# Patient Record
Sex: Female | Born: 1970
Health system: Southern US, Community
[De-identification: ages and names within clinical notes are randomized; demographics above are authoritative.]

## PROBLEM LIST (undated history)

## (undated) DIAGNOSIS — J45909 Unspecified asthma, uncomplicated: Secondary | ICD-10-CM

## (undated) DIAGNOSIS — L409 Psoriasis, unspecified: Secondary | ICD-10-CM

## (undated) DIAGNOSIS — M1990 Unspecified osteoarthritis, unspecified site: Secondary | ICD-10-CM

## (undated) DIAGNOSIS — Z87442 Personal history of urinary calculi: Secondary | ICD-10-CM

## (undated) DIAGNOSIS — G5600 Carpal tunnel syndrome, unspecified upper limb: Secondary | ICD-10-CM

## (undated) DIAGNOSIS — E119 Type 2 diabetes mellitus without complications: Secondary | ICD-10-CM

## (undated) DIAGNOSIS — K219 Gastro-esophageal reflux disease without esophagitis: Secondary | ICD-10-CM

## (undated) DIAGNOSIS — M129 Arthropathy, unspecified: Secondary | ICD-10-CM

## (undated) DIAGNOSIS — K5900 Constipation, unspecified: Secondary | ICD-10-CM

## (undated) DIAGNOSIS — M797 Fibromyalgia: Secondary | ICD-10-CM

## (undated) DIAGNOSIS — K589 Irritable bowel syndrome without diarrhea: Secondary | ICD-10-CM

## (undated) DIAGNOSIS — K7689 Other specified diseases of liver: Secondary | ICD-10-CM

## (undated) DIAGNOSIS — E669 Obesity, unspecified: Secondary | ICD-10-CM

## (undated) DIAGNOSIS — I10 Essential (primary) hypertension: Secondary | ICD-10-CM

## (undated) HISTORY — DX: Unspecified asthma, uncomplicated: J45.909

## (undated) HISTORY — DX: Gastro-esophageal reflux disease without esophagitis: K21.9

## (undated) HISTORY — DX: Other specified diseases of liver: K76.89

## (undated) HISTORY — PX: VAGINAL HYSTERECTOMY: SHX2639

## (undated) HISTORY — PX: BLADDER SURGERY: SHX569

## (undated) HISTORY — DX: Carpal tunnel syndrome, unspecified upper limb: G56.00

## (undated) HISTORY — DX: Type 2 diabetes mellitus without complications: E11.9

## (undated) HISTORY — DX: Constipation, unspecified: K59.00

## (undated) HISTORY — DX: Personal history of urinary calculi: Z87.442

## (undated) HISTORY — PX: COLONOSCOPY: SHX174

## (undated) HISTORY — DX: Arthropathy, unspecified: M12.9

## (undated) HISTORY — DX: Fibromyalgia: M79.7

## (undated) HISTORY — DX: Obesity, unspecified: E66.9

## (undated) HISTORY — DX: Essential (primary) hypertension: I10

## (undated) HISTORY — PX: OTHER SURGICAL HISTORY: SHX169

## (undated) HISTORY — DX: Irritable bowel syndrome, unspecified: K58.9

## (undated) HISTORY — DX: Unspecified osteoarthritis, unspecified site: M19.90

---

## 1999-05-07 ENCOUNTER — Emergency Department (HOSPITAL_COMMUNITY): Admission: EM | Admit: 1999-05-07 | Discharge: 1999-05-07 | Payer: Self-pay | Admitting: Emergency Medicine

## 1999-05-08 ENCOUNTER — Encounter: Payer: Self-pay | Admitting: Emergency Medicine

## 2001-06-30 ENCOUNTER — Inpatient Hospital Stay (HOSPITAL_COMMUNITY): Admission: AD | Admit: 2001-06-30 | Discharge: 2001-06-30 | Payer: Self-pay | Admitting: Obstetrics and Gynecology

## 2001-12-12 ENCOUNTER — Inpatient Hospital Stay (HOSPITAL_COMMUNITY): Admission: AD | Admit: 2001-12-12 | Discharge: 2001-12-12 | Payer: Self-pay | Admitting: Obstetrics and Gynecology

## 2002-02-20 ENCOUNTER — Inpatient Hospital Stay (HOSPITAL_COMMUNITY): Admission: AD | Admit: 2002-02-20 | Discharge: 2002-02-23 | Payer: Self-pay | Admitting: Obstetrics & Gynecology

## 2002-02-27 ENCOUNTER — Inpatient Hospital Stay (HOSPITAL_COMMUNITY): Admission: AD | Admit: 2002-02-27 | Discharge: 2002-02-27 | Payer: Self-pay | Admitting: Obstetrics and Gynecology

## 2002-03-02 ENCOUNTER — Inpatient Hospital Stay (HOSPITAL_COMMUNITY): Admission: AD | Admit: 2002-03-02 | Discharge: 2002-03-02 | Payer: Self-pay | Admitting: Obstetrics and Gynecology

## 2002-04-05 ENCOUNTER — Other Ambulatory Visit: Admission: RE | Admit: 2002-04-05 | Discharge: 2002-04-05 | Payer: Self-pay | Admitting: *Deleted

## 2003-03-27 ENCOUNTER — Encounter: Admission: RE | Admit: 2003-03-27 | Discharge: 2003-03-27 | Payer: Self-pay | Admitting: Family Medicine

## 2003-03-27 ENCOUNTER — Encounter: Payer: Self-pay | Admitting: Family Medicine

## 2003-04-16 ENCOUNTER — Other Ambulatory Visit: Admission: RE | Admit: 2003-04-16 | Discharge: 2003-04-16 | Payer: Self-pay | Admitting: *Deleted

## 2003-06-20 ENCOUNTER — Encounter: Admission: RE | Admit: 2003-06-20 | Discharge: 2003-06-20 | Payer: Self-pay | Admitting: Internal Medicine

## 2003-06-20 ENCOUNTER — Encounter: Payer: Self-pay | Admitting: Internal Medicine

## 2004-04-23 ENCOUNTER — Other Ambulatory Visit: Admission: RE | Admit: 2004-04-23 | Discharge: 2004-04-23 | Payer: Self-pay | Admitting: *Deleted

## 2004-07-27 ENCOUNTER — Encounter: Admission: RE | Admit: 2004-07-27 | Discharge: 2004-07-27 | Payer: Self-pay | Admitting: Family Medicine

## 2004-09-21 ENCOUNTER — Encounter: Admission: RE | Admit: 2004-09-21 | Discharge: 2004-09-21 | Payer: Self-pay | Admitting: Family Medicine

## 2005-01-14 ENCOUNTER — Ambulatory Visit: Payer: Self-pay | Admitting: Gastroenterology

## 2005-01-15 ENCOUNTER — Encounter: Admission: RE | Admit: 2005-01-15 | Discharge: 2005-01-15 | Payer: Self-pay | Admitting: Gastroenterology

## 2005-01-29 ENCOUNTER — Ambulatory Visit: Payer: Self-pay | Admitting: Gastroenterology

## 2005-02-05 ENCOUNTER — Encounter: Admission: RE | Admit: 2005-02-05 | Discharge: 2005-05-06 | Payer: Self-pay | Admitting: Gastroenterology

## 2005-04-30 ENCOUNTER — Ambulatory Visit: Payer: Self-pay | Admitting: Gastroenterology

## 2005-04-30 ENCOUNTER — Other Ambulatory Visit: Admission: RE | Admit: 2005-04-30 | Discharge: 2005-04-30 | Payer: Self-pay | Admitting: Obstetrics and Gynecology

## 2005-11-16 ENCOUNTER — Ambulatory Visit: Payer: Self-pay | Admitting: Gastroenterology

## 2006-08-11 ENCOUNTER — Encounter: Admission: RE | Admit: 2006-08-11 | Discharge: 2006-08-11 | Payer: Self-pay | Admitting: Obstetrics and Gynecology

## 2006-10-13 ENCOUNTER — Ambulatory Visit (HOSPITAL_COMMUNITY): Admission: RE | Admit: 2006-10-13 | Discharge: 2006-10-13 | Payer: Self-pay | Admitting: Obstetrics and Gynecology

## 2007-08-16 ENCOUNTER — Encounter: Admission: RE | Admit: 2007-08-16 | Discharge: 2007-08-16 | Payer: Self-pay | Admitting: Obstetrics and Gynecology

## 2008-01-05 ENCOUNTER — Telehealth (INDEPENDENT_AMBULATORY_CARE_PROVIDER_SITE_OTHER): Payer: Self-pay | Admitting: *Deleted

## 2008-08-16 ENCOUNTER — Encounter: Admission: RE | Admit: 2008-08-16 | Discharge: 2008-08-16 | Payer: Self-pay | Admitting: Obstetrics and Gynecology

## 2009-08-19 ENCOUNTER — Encounter: Admission: RE | Admit: 2009-08-19 | Discharge: 2009-08-19 | Payer: Self-pay | Admitting: Obstetrics and Gynecology

## 2009-11-04 ENCOUNTER — Encounter: Payer: Self-pay | Admitting: Gastroenterology

## 2010-02-04 ENCOUNTER — Encounter: Payer: Self-pay | Admitting: Gastroenterology

## 2010-07-01 ENCOUNTER — Encounter: Payer: Self-pay | Admitting: Gastroenterology

## 2010-08-07 ENCOUNTER — Encounter: Payer: Self-pay | Admitting: Gastroenterology

## 2010-08-21 ENCOUNTER — Encounter: Admission: RE | Admit: 2010-08-21 | Discharge: 2010-08-21 | Payer: Self-pay | Admitting: Obstetrics and Gynecology

## 2010-09-07 ENCOUNTER — Encounter: Payer: Self-pay | Admitting: Gastroenterology

## 2010-09-22 ENCOUNTER — Encounter: Payer: Self-pay | Admitting: Gastroenterology

## 2010-11-04 ENCOUNTER — Encounter: Payer: Self-pay | Admitting: Gastroenterology

## 2010-11-26 ENCOUNTER — Encounter (INDEPENDENT_AMBULATORY_CARE_PROVIDER_SITE_OTHER): Payer: Self-pay | Admitting: *Deleted

## 2010-12-01 ENCOUNTER — Encounter: Payer: Self-pay | Admitting: Gastroenterology

## 2011-01-05 DIAGNOSIS — K7689 Other specified diseases of liver: Secondary | ICD-10-CM | POA: Insufficient documentation

## 2011-01-05 DIAGNOSIS — K5900 Constipation, unspecified: Secondary | ICD-10-CM | POA: Insufficient documentation

## 2011-01-05 DIAGNOSIS — K589 Irritable bowel syndrome without diarrhea: Secondary | ICD-10-CM | POA: Insufficient documentation

## 2011-01-05 DIAGNOSIS — K219 Gastro-esophageal reflux disease without esophagitis: Secondary | ICD-10-CM | POA: Insufficient documentation

## 2011-01-07 ENCOUNTER — Ambulatory Visit
Admission: RE | Admit: 2011-01-07 | Discharge: 2011-01-07 | Payer: Self-pay | Source: Home / Self Care | Attending: Gastroenterology | Admitting: Gastroenterology

## 2011-01-07 ENCOUNTER — Encounter (INDEPENDENT_AMBULATORY_CARE_PROVIDER_SITE_OTHER): Payer: Self-pay | Admitting: *Deleted

## 2011-01-07 DIAGNOSIS — M129 Arthropathy, unspecified: Secondary | ICD-10-CM | POA: Insufficient documentation

## 2011-01-07 DIAGNOSIS — J45909 Unspecified asthma, uncomplicated: Secondary | ICD-10-CM | POA: Insufficient documentation

## 2011-01-07 DIAGNOSIS — E669 Obesity, unspecified: Secondary | ICD-10-CM | POA: Insufficient documentation

## 2011-01-07 DIAGNOSIS — I10 Essential (primary) hypertension: Secondary | ICD-10-CM | POA: Insufficient documentation

## 2011-01-07 DIAGNOSIS — Z87442 Personal history of urinary calculi: Secondary | ICD-10-CM | POA: Insufficient documentation

## 2011-01-13 ENCOUNTER — Ambulatory Visit (HOSPITAL_COMMUNITY)
Admission: RE | Admit: 2011-01-13 | Discharge: 2011-01-13 | Payer: Self-pay | Source: Home / Self Care | Attending: Gastroenterology | Admitting: Gastroenterology

## 2011-01-15 ENCOUNTER — Ambulatory Visit
Admission: RE | Admit: 2011-01-15 | Discharge: 2011-01-15 | Payer: Self-pay | Source: Home / Self Care | Attending: Gastroenterology | Admitting: Gastroenterology

## 2011-01-15 ENCOUNTER — Other Ambulatory Visit: Payer: Self-pay | Admitting: Gastroenterology

## 2011-01-15 LAB — FECAL OCCULT BLOOD, IMMUNOCHEMICAL: Fecal Occult Bld: NEGATIVE

## 2011-01-17 ENCOUNTER — Encounter: Payer: Self-pay | Admitting: Obstetrics and Gynecology

## 2011-01-26 NOTE — Letter (Signed)
Summary: New Patient letter  [facility]  [address] [place], [place] [address]   Phone: [phone]  Fax: [phone]       11/26/2010 MRN: [id number]  Andrea Rhodes [address] [place], [place]  [address]  Dear Ms. Rhodes,  Welcome to the Gastroenterology Division at [facility].    You are scheduled to see Dr.  Jarold Motto on 01-07-11 at 8:30a.m. on the 3rd floor at [facility], [address]  We ask that you try to arrive at our office 15 minutes prior to your appointment time to allow for check-in.  We would like you to complete the enclosed self-administered evaluation form prior to your visit and bring it with you on the day of your appointment.  We will review it with you.  Also, please bring a complete list of all your medications or, if you prefer, bring the medication bottles and we will list them.  Please bring your insurance card so that we may make a copy of it.  If your insurance requires a referral to see a specialist, please bring your referral form from your primary care physician.  Co-payments are due at the time of your visit and may be paid by cash, check or credit card.     Your office visit will consist of a consult with your physician (includes a physical exam), any laboratory testing he/she may order, scheduling of any necessary diagnostic testing (e.g. x-ray, ultrasound, CT-scan), and scheduling of a procedure (e.g. Endoscopy, Colonoscopy) if required.  Please allow enough time on your schedule to allow for any/all of these possibilities.    If you cannot keep your appointment, please call [phone] to cancel or reschedule prior to your appointment date.  This allows [place] the opportunity to schedule an appointment for another patient in need of care.  If you do not cancel or reschedule by 5 p.m. the business day prior to your appointment date, you will be charged a $50.00 late cancellation/no-show fee.    Thank you for choosing  [facility] for your medical needs.  We appreciate the opportunity to care for you.  Please visit [place] at our website  to learn more about our practice.                     Sincerely,                                                             The Gastroenterology Division

## 2011-01-28 NOTE — Letter (Signed)
Summary: Roselle Locus MD  Roselle Locus MD   Imported By: Lester [place] 01/12/2011 08:48:47  _____________________________________________________________________  External Attachment:    Type:   Image     Comment:   External Document

## 2011-01-28 NOTE — Letter (Signed)
Summary: Roselle Locus MD  Roselle Locus MD   Imported By: Lester [place] 01/12/2011 08:51:13  _____________________________________________________________________  External Attachment:    Type:   Image     Comment:   External Document

## 2011-01-28 NOTE — Letter (Signed)
Summary: Roselle Locus MD  Roselle Locus MD   Imported By: Lester [place] 01/12/2011 08:50:14  _____________________________________________________________________  External Attachment:    Type:   Image     Comment:   External Document

## 2011-01-28 NOTE — Letter (Signed)
Summary: [facility]: Immunoassay Fecal Occult Blood (iFOB) Order [facility]  [address] [place], [place] [address]   Phone: [phone]  Fax: [phone]      [facility]: Immunoassay Fecal Occult Blood (iFOB) Order Form   January 07, 2011 MRN: [id number]   Andrea Rhodes June 05, 1971   Physicican Name: Dr Sheryn Bison  Diagnosis Code:564.1/571.8     Harlow Mares CMA (AAMA)

## 2011-01-28 NOTE — Letter (Signed)
Summary: Roselle Locus MD  Roselle Locus MD   Imported By: Lester [place] 01/12/2011 08:52:03  _____________________________________________________________________  External Attachment:    Type:   Image     Comment:   External Document

## 2011-01-28 NOTE — Letter (Signed)
Summary: Roselle Locus MD  Roselle Locus MD   Imported By: Lester [place] 01/12/2011 08:46:38  _____________________________________________________________________  External Attachment:    Type:   Image     Comment:   External Document

## 2011-01-28 NOTE — Letter (Signed)
Summary: Roselle Locus MD  Roselle Locus MD   Imported By: Lester [place] 01/12/2011 08:47:39  _____________________________________________________________________  External Attachment:    Type:   Image     Comment:   External Document

## 2011-01-28 NOTE — Assessment & Plan Note (Signed)
Summary: discuss problems w/liver--ch.   History of Present Illness Visit Type: Initial Consult Primary GI MD: Sheryn Bison MD FACP FAGA Primary Provider: Barton Fanny, MD Requesting Provider: Barton Fanny, MD and  Harold Hedge, MD Chief Complaint: Patient here to discuss fatty liver disease. She needs to be put on medication which may affect her liver and Dr Henderson Cloud wanted her liver to be checked before doing so. History of Present Illness:   40 year old African American female with known fatty liver referred for repeat hepatic evaluation pending possible birth control pill initiation by Dr. Huntley Dec for her mild elevated serum testosterone levels.  Andrea Rhodes was evaluated several years ago and found to have fatty liver. Followup liver enzymes were normal. CT Scan in January 2006 was unremarkable except for a fatty liver. Extensive hepatic workup was not undertaken, but it was suggested that she go on a planned dietary and exercise program.Apparently she was lost to followup and has done well.  She denies right upper quadrant pain but does occasionally have some spasmodic-type pain in her right lower quadrant with irritable bowel syndrome type complaints. Her bowels currently are regular on flaxseed one capsule twice a day. Previously she had problems with constipation predominant IBS. Other meds include HCTZ and vitamin D. She takes p.r.n. Zegerid for acid reflux without dysphagia.  He specifically denies clay-colored stools, dark urine, icterus, mental status changes, neuritis, or abuse of ethanol or NSAIDs. She denies any significant systemic complaints such as fever or chills. There is no family history of known liver disease or gallbladder disease. She denies any known infectious disease exposure or previous hepatitis or pancreatitis. Review of her liver enzymes show a normal liver profile including serum albumin level.   GI Review of Systems    Reports acid reflux and  chest pain.     Denies abdominal pain, belching, bloating, dysphagia with liquids, dysphagia with solids, heartburn, loss of appetite, nausea, vomiting, vomiting blood, weight loss, and  weight gain.      Reports hemorrhoids and  liver problems.     Denies anal fissure, black tarry stools, change in bowel habit, constipation, diarrhea, diverticulosis, fecal incontinence, heme positive stool, irritable bowel syndrome, jaundice, light color stool, rectal bleeding, and  rectal pain. Preventive Screening-Counseling & Management  Caffeine-Diet-Exercise     Does Patient Exercise: no      Drug Use:  no.      Current Medications (verified): 1)  Hydrochlorothiazide 25 Mg Tabs (Hydrochlorothiazide) .... Take 1 Tablet By Mouth Once Daily 2)  Vitamin D (Ergocalciferol) 50000 Unit Caps (Ergocalciferol) .... Take 1 Capsule By Mouth Once Per Week 3)  Flax  Oil (Flaxseed (Linseed)) .... Take 1 Capsule By Mouth Two Times A Day 4)  Zegerid 40-1100 Mg Caps (Omeprazole-Sodium Bicarbonate) .... Take 1 Capsule By Mouth As Needed  Allergies: 1)  ! Demerol 2)  ! * Nystatin 3)  ! * Silver 4)  ! Durward Fortes  Past History:  Past medical, surgical, family and social histories (including risk factors) reviewed for relevance to current acute and chronic problems.  Past Medical History: Current Problems:  NEPHROLITHIASIS, HX OF (ICD-V13.01) HYPERTENSION (ICD-401.9) ASTHMA, UNSPECIFIED (ICD-493.90) ARTHRITIS (ICD-716.90) FATTY LIVER DISEASE (ICD-571.8) GERD (ICD-530.81) CONSTIPATION (ICD-564.00) IRRITABLE BOWEL SYNDROME (ICD-564.1)    Past Surgical History: laparoscopy 1984 Bladder Surgery  Family History: Reviewed history from 01/05/2011 and no changes required. Family History of Colitis: aunt, cousin Family History of Lung Cancer: Father Family History of Breast Cancer: Cousin No FH of Colon Cancer: Family  History of Diabetes: Mother  Social History: Reviewed history from 01/05/2011 and no changes  required. Patient has never smoked.  Alcohol Use - no Illicit Drug Use - no Patient does not get regular exercise.  Drug Use:  no Does Patient Exercise:  no  Review of Systems       The patient complains of arthritis/joint pain, blood in urine, and urine leakage.  The patient denies allergy/sinus, anemia, anxiety-new, back pain, breast changes/lumps, change in vision, confusion, cough, coughing up blood, depression-new, fainting, fatigue, fever, headaches-new, hearing problems, heart murmur, heart rhythm changes, itching, menstrual pain, muscle pains/cramps, night sweats, nosebleeds, pregnancy symptoms, shortness of breath, skin rash, sleeping problems, sore throat, swelling of feet/legs, swollen lymph glands, thirst - excessive , urination - excessive , urination changes/pain, vision changes, and voice change.    Vital Signs:  Patient profile:   40 year old female Height:      63 inches Weight:      195.13 pounds BMI:     34.69 BSA:     1.92 Pulse rate:   72 / minute Pulse rhythm:   regular BP sitting:   122 / 88  (left arm)  Vitals Entered By: Lamona Curl CMA (AAMA) (January 07, 2011 8:40 AM)  Physical Exam  General:  Well developed, well nourished, no acute distress.healthy appearing.  BMI today is 35, she has mild obesity. Head:  Normocephalic and atraumatic. Eyes:  PERRLA, no icterus.exam deferred to patient's ophthalmologist.   Lungs:  Clear throughout to auscultation. Heart:  Regular rate and rhythm; no murmurs, rubs,  or bruits. Abdomen:  Soft, nontender and nondistended. No masses, hepatosplenomegaly or hernias noted. Normal bowel sounds.I cannot appreciate hepatosplenomegaly, other abdominal masses or tenderness. Bowel sounds are normal. Extremities:  No clubbing, cyanosis, edema or deformities noted. Neurologic:  Alert and  oriented x4;  grossly normal neurologically. Skin:  Intact without significant lesions or rashes. Psych:  Alert and cooperative. Normal mood  and affect.   Impression & Recommendations:  Problem # 1:  FATTY LIVER DISEASE (ICD-571.8) Assessment Unchanged She Does Not have organomegaly on exam or any right upper quadrant pain or abnormal liver function tests. She is stable fatty liver without evidence of Nash syndrome or hepatic insufficiency. We will repeat her upper abdominal ultrasound. If this is unremarkable I see no contraindication to birth control p.o. initiation. She should however have liver function tests performed every 6 months. Orders: Ultrasound Abdomen (UAS)  Problem # 2:  HYPERTENSION (ICD-401.9) Assessment: Unchanged Blood Pressure today is 122 /88 on HCTZ.  Problem # 3:  GERD (ICD-530.81) Assessment: Improved Continued uses Allegra p.r.n. basis with standard anti-reflex maneuvers.  Problem # 4:  IRRITABLE BOWEL SYNDROME (ICD-564.1) Assessment: Improved Continue high-fiber diet as tolerated with p.r.n. sublingual Levsin.  Problem # 5:  OBESITY (ICD-278.00) Assessment: Unchanged BMI is 35, but her weight is stable. I see no indication for consideration of bariatric surgery.  Patient Instructions: 1)  Copy sent to : Barton Fanny, MD and Harold Hedge, MD 2)  Please go to the basement today for your labs.  3)  Your prescription(s) have been sent to you pharmacy.  4)  Your abdominal ultrasound is scheduled for 01/13/2011, please follow the seperate instructions. 5)  The medication list was reviewed and reconciled.  All changed / newly prescribed medications were explained.  A complete medication list was provided to the patient / caregiver. Prescriptions: LEVSIN/SL 0.125 MG SUBL (HYOSCYAMINE SULFATE) dissolve one tablet on your tounge two times a  day as needed  #60 x 3   Entered by:   Harlow Mares CMA (AAMA)   Authorized by:   Mardella Layman MD [facility]   Signed by:   Harlow Mares CMA (AAMA) on 01/07/2011   Method used:   Electronically to        [id number] (retail)       [address]       [place], [place]  [address]       Ph: [phone]       Fax: [phone]   RxID:   [id number]

## 2011-05-14 NOTE — Op Note (Signed)
NAME:  Andrea Rhodes, Andrea Rhodes                 ACCOUNT NO.:  [id number]   MEDICAL RECORD NO.:  [id number]          PATIENT TYPE:  AMB   LOCATION:  SDC                           FACILITY:  [facility]   PHYSICIAN:  Guy Sandifer. Henderson Cloud, M.D. DATE OF BIRTH:  1971/11/29   DATE OF PROCEDURE:  10/13/2006  DATE OF DISCHARGE:                                 OPERATIVE REPORT   PREOPERATIVE DIAGNOSIS:  Stress urinary incontinence.   POSTOPERATIVE DIAGNOSIS:  Stress urinary incontinence.   PROCEDURE:  Mid urethral Lynx sling.   SURGEON:  Guy Sandifer. Henderson Cloud, M.D.   INTEROPERATIVE CONSULTATION:  Randye Lobo, M.D.   ANESTHESIA:  General endotracheal intubation.   ESTIMATED BLOOD LOSS:  Less than 50 mL.   COMPLICATIONS:  Urethral contusion.   INDICATIONS AND CONSENT:  This patient is a 40 year old married black female  G3, P3, with an IUD in place.  She has symptoms of stress incontinence.  Details are dictated in the history and physical.  A mid urethral sling has  been discussed preoperatively.  The potential risks and complications have  been discussed preoperatively including but limited to infection, organ  damage, bleeding requiring transfusion of blood products with possible  transfusion reaction, HIV and hepatitis acquisition, DVT, PE, pneumonia,  erosion, persistent or recurrent incontinence, inability to urinate  requiring catheterization, possible need for return to the OR, dyspareunia.  All questions have been answered and consent is signed on the chart.   DESCRIPTION OF PROCEDURE:  The patient is taken to the operating room where  she is identified and placed in the dorsal supine position and general  anesthesia is induced with endotracheal intubation.  She is then placed in  dorsal lithotomy position where she is prepped and draped in sterile  fashion.  A weighted speculum was placed in vagina.  The suburethral vaginal  mucosa is infiltrated with 0.52% Xylocaine with 1:200,000 epinephrine.   Over  the symphysis pubis, a marking pen was used to identify the midline and two  incision points were marked 1-2 fingerbreadths lateral to the midline at the  superior margin of the pubic symphysis.  These areas were also injected with  the same Xylocaine with epinephrine solution.  A 1-2 cm suburethral incision  is made in the mucosa.  Dissection is carried out bilaterally to the  symphysis pubis not penetrating the urogenital diaphragm.  A Foley catheter  was placed in the bladder and the bladder is drained.  The left [place] needle  is passed without difficulty.  The right [place] needle was also passed.  While  passing the needle, it was felt that it may be close to the urethra.  It is  withdrawn and then passed.  The Foley catheter is withdrawn and cystoscopy  is carried out with the 70 degrees scope.  The bladder is without evidence  of perforation or foreign body.  A good puff from the ureters is noted  bilaterally.  There is a small amount of blood noted from urethral meatus.  While withdrawing the cystoscope, there is a possible area of perforation  noted.  However, repeated examination with the cystoscope with the 70 and 25  degrees lenses fails to reveal any point of distinct penetration of the  urethra.  Dr. Conley Simmonds was also consulted.  She scrubbed into the case  and also performed cystoscopy and could not identify any area of obvious  urethral penetration.  Therefore, the [place] sling is attached to the needles,  withdrawn through the suprapubic incisions in the customary fashion.  After  removing the sheath, the sling is noted to be just under the urethra.  A  Kelly clamp could be placed under the urethra and rotated perpendicular to  the floor without any tension on the sling.  Cystoscopy is again performed  by me and Dr. Edward Jolly, both, with the 70 degrees cystoscope.  Again, there is  no evidence of perforation of the bladder or foreign body.  A good puff of  the ureters is  noted bilaterally.  Again, reinspection of the urethra fails  to reveal any area of penetration.  A Silastic Foley is placed in the  bladder.  It will be left in place for 7-10 days postoperatively.  The sling  is trimmed at the suprapubic skin incisions.  Dermabond was applied there.  A 2-0 Vicryl suture is in a running locking fashion was used to close the  vaginal mucosa.  All counts were correct.  The patient is awakened and taken  to the recovery room in stable condition.      Guy Sandifer Henderson Cloud, M.D.  Electronically Signed     JET/MEDQ  D:  10/13/2006  T:  10/14/2006  Job:  [id number]

## 2011-05-14 NOTE — H&P (Signed)
NAME:  Andrea Rhodes, Andrea Rhodes                 ACCOUNT NO.:  [id number]   MEDICAL RECORD NO.:  [id number]          PATIENT TYPE:  AMB   LOCATION:                                [facility]ACILITY:  [facility]   PHYSICIAN:  Guy Sandifer. Henderson Cloud, M.D.      DATE OF BIRTH:   DATE OF ADMISSION:  10/13/2006  DATE OF DISCHARGE:                                HISTORY & PHYSICAL   CHIEF COMPLAINT:  Leaking urine.   HISTORY OF PRESENT ILLNESS:  This patient is a 40 year old, married, black  female, gravida 3, para 2, who has an IUD in place.  She has had symptoms of  urgency as well as leaking with any coughing, laughing, etc.  This  necessitates wearing a panty liner every day.  A trial of multiple  medications for bladder urgency resulted in excessive dry mouth.  Urodynamic  evaluation was compatible with stress urinary incontinence.  After  discussion of the options of treatment, she is being admitted for a mid  urethral sling.  Potential risks and complications have been carefully  discussed preoperatively.   PAST MEDICAL HISTORY:  1. Esophageal reflux.  2. History of kidney stones.   PAST SURGICAL HISTORY:  1. Wisdom teeth extraction.  2. Laparoscopy.   OB HISTORY:  Vaginal delivery x2 (28 weeks loss x1).   FAMILY HISTORY:  Enlarged heart, paternal grandmother.  Chronic hypertension  mother.  Rheumatoid arthritis paternal  grandmother.  Stroke, maternal great  grandmother, paternal grandmother.  Migraine headaches maternal grandmother.   MEDICATIONS:  Prilosec.   ALLERGIES:  DEMEROL.   REVIEW OF SYSTEMS:  NEUROLOGIC:  Denies headache.  CARDIAC:  Denies chest  pain.  PULMONARY:  Denies shortness of breath.  GI:  Denies recent changes  in bowel habits.   PHYSICAL EXAMINATION:  VITAL SIGNS:  Height 5 feet 2 inches, weight 195-1/2  pounds.  Blood pressure 114/82.  HEENT:  Without thyromegaly.  LUNGS:  Clear to auscultation.  HEART:  Regular rate and rhythm.  BACK:  Without CVA tenderness.  BREASTS: Without  masses, retraction or discharge.  ABDOMEN:  Soft, nontender without masses.  PELVIC:  Vulva, vagina and cervix without lesion.  IUD string noted.  Uterus  normal size, mobile, nontender.  Adnexa nontender without masses.  EXTREMITIES:  Grossly within normal limits.  NEUROLOGIC: Grossly within normal limits.   ASSESSMENT:  Stress urinary incontinence.   PLAN:  Mid urethral sling.      Guy Sandifer Henderson Cloud, M.D.  Electronically Signed     JET/MEDQ  D:  10/12/2006  T:  10/13/2006  Job:  [id number]

## 2011-07-13 ENCOUNTER — Other Ambulatory Visit: Payer: Self-pay | Admitting: Obstetrics and Gynecology

## 2011-07-13 DIAGNOSIS — Z1231 Encounter for screening mammogram for malignant neoplasm of breast: Secondary | ICD-10-CM

## 2011-08-23 ENCOUNTER — Ambulatory Visit
Admission: RE | Admit: 2011-08-23 | Discharge: 2011-08-23 | Disposition: A | Discharge status: Home / Self Care | Payer: BC Managed Care – PPO | Source: Ambulatory Visit | Attending: Obstetrics and Gynecology | Admitting: Obstetrics and Gynecology

## 2011-08-23 DIAGNOSIS — Z1231 Encounter for screening mammogram for malignant neoplasm of breast: Secondary | ICD-10-CM

## 2012-02-22 ENCOUNTER — Encounter: Payer: Self-pay | Admitting: Cardiovascular Disease

## 2012-02-22 ENCOUNTER — Encounter: Payer: Self-pay | Admitting: *Deleted

## 2012-02-23 ENCOUNTER — Encounter: Payer: Self-pay | Admitting: Cardiovascular Disease

## 2012-02-23 ENCOUNTER — Ambulatory Visit (INDEPENDENT_AMBULATORY_CARE_PROVIDER_SITE_OTHER): Payer: BC Managed Care – PPO | Admitting: Cardiovascular Disease

## 2012-02-23 DIAGNOSIS — R079 Chest pain, unspecified: Secondary | ICD-10-CM | POA: Insufficient documentation

## 2012-02-23 DIAGNOSIS — F329 Major depressive disorder, single episode, unspecified: Secondary | ICD-10-CM

## 2012-02-23 DIAGNOSIS — K219 Gastro-esophageal reflux disease without esophagitis: Secondary | ICD-10-CM

## 2012-02-23 DIAGNOSIS — I10 Essential (primary) hypertension: Secondary | ICD-10-CM

## 2012-02-23 LAB — SEDIMENTATION RATE: Sed Rate: 27 mm/hr — ABNORMAL HIGH (ref 0–22)

## 2012-02-23 NOTE — Assessment & Plan Note (Signed)
Well controlled.  Continue current medications and low sodium Dash type diet.    

## 2012-02-23 NOTE — Assessment & Plan Note (Signed)
Reactive due to family losses.  Consdier Cymbalta which will help with aches and pains also

## 2012-02-23 NOTE — Assessment & Plan Note (Addendum)
Atypical normal ECG and exam  Stress echo Labs to R/O connective tissue and pericarditis

## 2012-02-23 NOTE — Progress Notes (Signed)
Patient ID: Andrea Rhodes, female   DOB: Nov 19, 1971, 41 y.o.   MRN: [id number] 41 yo with atypical SSCP.  About a year sharp and piercing at times.  No pleuritic pain.  Has had multiple ECG;s with Dr Luciana Axe normal.  Has PT for neck and shoulder problems.  Has radial tunnel syndrome in arms Rx by Dr Amanda Pea.  Pain at rest or stress.  Has had a lot of loss in her life the last year with father grandfather and cousin dying.  No recent trauma.  No history of connective tissue disease or arthritides.  Denies dyspnea, palpitations, edema or syncope.  No previous stress test.  Does have reflux but pains come even if she takes her Zegerid  ROS: Denies fever, malais, weight loss, blurry vision, decreased visual acuity, cough, sputum, SOB, hemoptysis, pleuritic pain, palpitaitons, heartburn, abdominal pain, melena, lower extremity edema, claudication, or rash.  All other systems reviewed and negative   General: Affect appropriate Healthy:  appears stated age HEENT: normal Neck supple with no adenopathy JVP normal no bruits no thyromegaly Lungs clear with no wheezing and good diaphragmatic motion Heart:  S1/S2 no murmur,rub, gallop or click PMI normal Abdomen: benighn, BS positve, no tenderness, no AAA no bruit.  No HSM or HJR Distal pulses intact with no bruits No edema Neuro non-focal Skin warm and dry No muscular weakness  Medications Current Outpatient Prescriptions  Medication Sig Dispense Refill  . Cholecalciferol (VITAMIN D) 2000 UNITS CAPS Take 1 capsule by mouth daily.      . Flaxseed, Linseed, (FLAX SEEDS PO) Take 1 tablet by mouth daily.      . hydrochlorothiazide (HYDRODIURIL) 25 MG tablet Take 12.5 mg by mouth daily.       Marland Kitchen omeprazole-sodium bicarbonate (ZEGERID) 40-1100 MG per capsule Take 1 capsule by mouth daily before breakfast.      . Pyridoxine HCl (VITAMIN B6 PO) Take 1 tablet by mouth daily.        Allergies Meperidine hcl and Nystatin  Family History: No family history  on file.  Social History: History   Social History  . Marital Status: Married    Spouse Name: N/A    Number of Children: N/A  . Years of Education: N/A   Occupational History  . Not on file.   Social History Main Topics  . Smoking status: Never Smoker   . Smokeless tobacco: Not on file  . Alcohol Use: Not on file  . Drug Use: Not on file  . Sexually Active: Not on file   Other Topics Concern  . Not on file   Social History Narrative  . No narrative on file    Electrocardiogram:  NSR rate 72 normal  Assessment and Plan

## 2012-02-23 NOTE — Patient Instructions (Signed)
Your physician recommends that you schedule a follow-up appointment in: as needed Your physician recommends that you continue on your current medications as directed. Please refer to the Current Medication list given to you today. Your physician has requested that you have a stress echocardiogram. For further information please visit [email or web address] Please follow instruction sheet as given. DX CHEST PAIN Your physician recommends that you return for lab work in: SED RATE ANA AND RHEUMATOID FACTOR DX CHEST PAIN

## 2012-02-23 NOTE — Assessment & Plan Note (Signed)
Continue Zegerid  F/U EGD per Dr Jarold Motto

## 2012-02-24 LAB — RHEUMATOID FACTOR: Rhuematoid fact SerPl-aCnc: <10 IU/mL (ref <=14)

## 2012-02-24 LAB — ANA: Anti Nuclear Antibody(ANA): NEGATIVE

## 2012-02-28 NOTE — Progress Notes (Signed)
Addended by: Judithe Modest D on: 02/28/2012 12:20 PM   Modules accepted: Orders

## 2012-03-10 ENCOUNTER — Ambulatory Visit (HOSPITAL_COMMUNITY): Payer: BC Managed Care – PPO | Attending: Internal Medicine

## 2012-03-10 DIAGNOSIS — R42 Dizziness and giddiness: Secondary | ICD-10-CM | POA: Insufficient documentation

## 2012-03-10 DIAGNOSIS — R072 Precordial pain: Secondary | ICD-10-CM | POA: Insufficient documentation

## 2012-03-10 DIAGNOSIS — R5383 Other fatigue: Secondary | ICD-10-CM | POA: Insufficient documentation

## 2012-03-10 DIAGNOSIS — I10 Essential (primary) hypertension: Secondary | ICD-10-CM | POA: Insufficient documentation

## 2012-03-10 DIAGNOSIS — R5381 Other malaise: Secondary | ICD-10-CM | POA: Insufficient documentation

## 2012-03-10 DIAGNOSIS — J45909 Unspecified asthma, uncomplicated: Secondary | ICD-10-CM | POA: Insufficient documentation

## 2012-07-31 ENCOUNTER — Other Ambulatory Visit: Payer: Self-pay | Admitting: Obstetrics and Gynecology

## 2012-07-31 DIAGNOSIS — Z1231 Encounter for screening mammogram for malignant neoplasm of breast: Secondary | ICD-10-CM

## 2012-09-04 ENCOUNTER — Ambulatory Visit
Admission: RE | Admit: 2012-09-04 | Discharge: 2012-09-04 | Disposition: A | Discharge status: Home / Self Care | Payer: BC Managed Care – PPO | Source: Ambulatory Visit | Attending: Obstetrics and Gynecology | Admitting: Obstetrics and Gynecology

## 2012-09-04 DIAGNOSIS — Z1231 Encounter for screening mammogram for malignant neoplasm of breast: Secondary | ICD-10-CM

## 2013-07-31 ENCOUNTER — Other Ambulatory Visit: Payer: Self-pay

## 2013-07-31 DIAGNOSIS — Z1231 Encounter for screening mammogram for malignant neoplasm of breast: Secondary | ICD-10-CM

## 2013-09-05 ENCOUNTER — Ambulatory Visit
Admission: RE | Admit: 2013-09-05 | Discharge: 2013-09-05 | Disposition: A | Discharge status: Home / Self Care | Payer: BC Managed Care – PPO | Source: Ambulatory Visit

## 2013-09-05 DIAGNOSIS — Z1231 Encounter for screening mammogram for malignant neoplasm of breast: Secondary | ICD-10-CM

## 2013-09-10 ENCOUNTER — Other Ambulatory Visit: Payer: Self-pay | Admitting: Obstetrics and Gynecology

## 2013-09-10 DIAGNOSIS — R928 Other abnormal and inconclusive findings on diagnostic imaging of breast: Secondary | ICD-10-CM

## 2013-09-21 ENCOUNTER — Ambulatory Visit
Admission: RE | Admit: 2013-09-21 | Discharge: 2013-09-21 | Disposition: A | Discharge status: Home / Self Care | Payer: BC Managed Care – PPO | Source: Ambulatory Visit | Attending: Obstetrics and Gynecology | Admitting: Obstetrics and Gynecology

## 2013-09-21 DIAGNOSIS — R928 Other abnormal and inconclusive findings on diagnostic imaging of breast: Secondary | ICD-10-CM

## 2014-01-03 ENCOUNTER — Encounter: Payer: Self-pay | Admitting: Cardiovascular Disease

## 2014-01-14 ENCOUNTER — Encounter (HOSPITAL_BASED_OUTPATIENT_CLINIC_OR_DEPARTMENT_OTHER): Payer: Self-pay | Admitting: Emergency Medicine

## 2014-01-14 ENCOUNTER — Emergency Department (HOSPITAL_BASED_OUTPATIENT_CLINIC_OR_DEPARTMENT_OTHER)
Admission: EM | Admit: 2014-01-14 | Discharge: 2014-01-14 | Disposition: A | Discharge status: Home / Self Care | Payer: BC Managed Care – PPO | Attending: Emergency Medicine | Admitting: Emergency Medicine

## 2014-01-14 ENCOUNTER — Emergency Department (HOSPITAL_BASED_OUTPATIENT_CLINIC_OR_DEPARTMENT_OTHER): Payer: BC Managed Care – PPO

## 2014-01-14 DIAGNOSIS — K219 Gastro-esophageal reflux disease without esophagitis: Secondary | ICD-10-CM | POA: Insufficient documentation

## 2014-01-14 DIAGNOSIS — Z87442 Personal history of urinary calculi: Secondary | ICD-10-CM | POA: Insufficient documentation

## 2014-01-14 DIAGNOSIS — Z79899 Other long term (current) drug therapy: Secondary | ICD-10-CM | POA: Insufficient documentation

## 2014-01-14 DIAGNOSIS — E669 Obesity, unspecified: Secondary | ICD-10-CM | POA: Insufficient documentation

## 2014-01-14 DIAGNOSIS — I10 Essential (primary) hypertension: Secondary | ICD-10-CM | POA: Insufficient documentation

## 2014-01-14 DIAGNOSIS — J45909 Unspecified asthma, uncomplicated: Secondary | ICD-10-CM | POA: Insufficient documentation

## 2014-01-14 DIAGNOSIS — Z8739 Personal history of other diseases of the musculoskeletal system and connective tissue: Secondary | ICD-10-CM | POA: Insufficient documentation

## 2014-01-14 LAB — CBC WITH DIFFERENTIAL/PLATELET
BASOS PCT: 0 % (ref 0–1)
Basophils Absolute: 0 10*3/uL (ref 0.0–0.1)
Eosinophils Absolute: 0.2 10*3/uL (ref 0.0–0.7)
Eosinophils Relative: 2 % (ref 0–5)
HCT: 37.8 % (ref 36.0–46.0)
Hemoglobin: 12.7 g/dL (ref 12.0–15.0)
Lymphocytes Relative: 32 % (ref 12–46)
Lymphs Abs: 3.2 10*3/uL (ref 0.7–4.0)
MCH: 30.5 pg (ref 26.0–34.0)
MCHC: 33.6 g/dL (ref 30.0–36.0)
MCV: 90.9 fL (ref 78.0–100.0)
MONOS PCT: 8 % (ref 3–12)
Monocytes Absolute: 0.8 10*3/uL (ref 0.1–1.0)
Neutro Abs: 6 10*3/uL (ref 1.7–7.7)
Neutrophils Relative %: 59 % (ref 43–77)
Platelets: 335 10*3/uL (ref 150–400)
RBC: 4.16 MIL/uL (ref 3.87–5.11)
RDW: 12 % (ref 11.5–15.5)
WBC: 10.2 10*3/uL (ref 4.0–10.5)

## 2014-01-14 LAB — BASIC METABOLIC PANEL
BUN: 10 mg/dL (ref 6–23)
CO2: 25 mEq/L (ref 19–32)
Calcium: 8.9 mg/dL (ref 8.4–10.5)
Chloride: 103 mEq/L (ref 96–112)
Creatinine, Ser: 0.7 mg/dL (ref 0.50–1.10)
GFR calc Af Amer: >90 mL/min (ref >90)
GFR calc non Af Amer: >90 mL/min (ref >90)
Glucose, Bld: 103 mg/dL — ABNORMAL HIGH (ref 70–99)
POTASSIUM: 4 meq/L (ref 3.7–5.3)
Sodium: 142 mEq/L (ref 137–147)

## 2014-01-14 LAB — TROPONIN I

## 2014-01-14 MED ORDER — SUCRALFATE 1 GM/10ML PO SUSP: 1.0000 g | Freq: Three times a day (TID) | ORAL | Status: DC

## 2014-01-14 MED ORDER — GI COCKTAIL ~~LOC~~
30.0000 mL | Freq: Once | ORAL | Status: AC
  Administered 2014-01-14: 30 mL via ORAL
  Filled 2014-01-14: qty 30

## 2014-01-14 NOTE — ED Provider Notes (Signed)
CSN: [id number]     Arrival date & time 01/14/14  0345 History   First MD Initiated Contact with Patient 01/14/14 [phone]     Chief Complaint  Patient presents with  . Gastrophageal Reflux   (Consider location/radiation/quality/duration/timing/severity/associated sxs/prior Treatment) Patient is a 43 y.o. female presenting with GERD. The history is provided by the patient. No language interpreter was used.  Gastrophageal Reflux This is a recurrent problem. The current episode started 12 to 24 hours ago. The problem occurs constantly. The problem has not changed since onset.Pertinent negatives include no abdominal pain and no shortness of breath. Nothing aggravates the symptoms. Nothing relieves the symptoms. Treatments tried: zegrid. The treatment provided no relief.  Bitter acidic taste in the throat burning.  Ate hot chips and sandwich with mayo just prior to going to bed and missed 2 days of zegrid  Past Medical History  Diagnosis Date  . HYPERTENSION   . OBESITY   . NEPHROLITHIASIS, HX OF   . Irritable bowel syndrome   . GERD   . FATTY LIVER DISEASE   . CONSTIPATION   . ASTHMA, UNSPECIFIED   . ARTHRITIS    Past Surgical History  Procedure Laterality Date  . Laparoscopy 1984    . Bladder surgery     No family history on file. History  Substance Use Topics  . Smoking status: Never Smoker   . Smokeless tobacco: Not on file  . Alcohol Use: Not on file   OB History   Grav Para Term Preterm Abortions TAB SAB Ect Mult Living                 Review of Systems  Respiratory: Negative for cough and shortness of breath.   Gastrointestinal: Negative for abdominal pain.  All other systems reviewed and are negative.    Allergies  Meperidine hcl and Nystatin  Home Medications   Current Outpatient Rx  Name  Route  Sig  Dispense  Refill  . Cholecalciferol (VITAMIN D) 2000 UNITS CAPS   Oral   Take 1 capsule by mouth daily.         . Flaxseed, Linseed, (FLAX SEEDS PO)  Oral   Take 1 tablet by mouth daily.         . hydrochlorothiazide (HYDRODIURIL) 25 MG tablet   Oral   Take 12.5 mg by mouth daily.          Marland Kitchen omeprazole-sodium bicarbonate (ZEGERID) 40-1100 MG per capsule   Oral   Take 1 capsule by mouth daily before breakfast.         . Pyridoxine HCl (VITAMIN B6 PO)   Oral   Take 1 tablet by mouth daily.          There were no vitals taken for this visit. Physical Exam  Constitutional: She is oriented to person, place, and time. She appears well-developed and well-nourished. No distress.  HENT:  Head: Normocephalic and atraumatic.  Mouth/Throat: Oropharynx is clear and moist.  Eyes: Conjunctivae are normal. Pupils are equal, round, and reactive to light.  Neck: Normal range of motion. Neck supple.  Cardiovascular: Normal rate, regular rhythm and intact distal pulses.   Pulmonary/Chest: Effort normal and breath sounds normal. She has no wheezes. She has no rales.  Abdominal: Soft. Bowel sounds are increased. There is no tenderness. There is no rebound and no guarding.  Musculoskeletal: Normal range of motion.  Neurological: She is alert and oriented to person, place, and time.  Skin: Skin is warm and  dry.  Psychiatric: She has a normal mood and affect.    ED Course  Procedures (including critical care time) Labs Review Labs Reviewed  CBC WITH DIFFERENTIAL  BASIC METABOLIC PANEL  TROPONIN I   Imaging Review No results found.  EKG Interpretation    Date/Time:  Monday January 14 2014 04:16:43 EST Ventricular Rate:  81 PR Interval:  176 QRS Duration: 88 QT Interval:  400 QTC Calculation: 464 R Axis:   3 Text Interpretation:  Normal sinus rhythm Confirmed by [facility]  MD, Adelena Desantiago ([id number]) on 01/14/2014 4:22:02 AM           PERC negative wells 0 MDM  No diagnosis found. Doubt cardiac event in setting of ongoing symptoms with negative EKG and normal troponin.  Patient states she knows she has not been following what  her GI doctor said as far as her GERD regimen.  Will discharge with follow up with patient's PMD and Carafate    Tariana Moldovan K Caidynce Muzyka-Rasch, MD 01/14/14 [phone]

## 2014-01-14 NOTE — Discharge Instructions (Signed)

## 2014-01-14 NOTE — ED Notes (Signed)
Pt states that she awoke to "burning" in her throat this morning. States it feels like "Acid" went into her lungs and she has been coughing. States she took a zegrid around 2130 for hx of acid reflux. States she ate a greasy meal last night. Denies any cp/sob. Pt. Clearing her throat during assessment.

## 2014-01-14 NOTE — ED Notes (Signed)
Returned from xray

## 2014-04-09 ENCOUNTER — Ambulatory Visit (INDEPENDENT_AMBULATORY_CARE_PROVIDER_SITE_OTHER): Payer: BC Managed Care – PPO | Admitting: Physician Assistant

## 2014-04-09 ENCOUNTER — Encounter: Payer: Self-pay | Admitting: Physician Assistant

## 2014-04-09 ENCOUNTER — Telehealth: Payer: Self-pay | Admitting: Gastroenterology

## 2014-04-09 VITALS — BP 112/80 | HR 91 | Ht 63.0 in | Wt 200.0 lb

## 2014-04-09 DIAGNOSIS — K645 Perianal venous thrombosis: Secondary | ICD-10-CM

## 2014-04-09 DIAGNOSIS — K644 Residual hemorrhoidal skin tags: Secondary | ICD-10-CM

## 2014-04-09 NOTE — Progress Notes (Signed)
i agree with the plan above 

## 2014-04-09 NOTE — Progress Notes (Signed)
Patient ID: Andrea Rhodes, female    DOB: February 02, 1971, 43 y.o.   MRN: [id number]  HPI   Almira CoasterGina  Is a pleasant 43 year old African American female previously known to Dr. Jarold MottoPatterson. She has history of hypertension, GERD, and fatty liver disease. She was last seen in 2012. Height patient comes in today as an acute add on with complaints of rectal pain and bleeding. She relates that she did have a hemorrhoid once before that required excision by [facility] several years ago. She has not had prior colonoscopy. She says her initially symptoms started about 2 weeks ago when she says she saw small amount of blood on the tissue with wiping but had not been constipated and had not had any rectal discomfort. Last week she started having some discomfort with bowel movements and then felt a knot on the outside of her rectum. She says once the knot came up she did not see any blood for several days. She was seen by her primary care provider diagnosed with an external hemorrhoid and started on hydrocortisone cream. She says over this past weekend the hemorrhoid became larger and more painful. She says she's had increased discomfort with sitting ,walking or standing and she started oozing small amounts of blood again.    Review of Systems  Constitutional: Negative.   Eyes: Negative.   Respiratory: Negative.   Gastrointestinal: Positive for anal bleeding and rectal pain.  Endocrine: Negative.   Genitourinary: Negative.   Allergic/Immunologic: Negative.   Neurological: Negative.   Hematological: Negative.   Psychiatric/Behavioral: Negative.    Outpatient Prescriptions Prior to Visit  Medication Sig Dispense Refill  . Cholecalciferol (VITAMIN D) 2000 UNITS CAPS Take 1 capsule by mouth daily.      . hydrochlorothiazide (HYDRODIURIL) 25 MG tablet Take 12.5 mg by mouth daily.       Marland Kitchen. omeprazole-sodium bicarbonate (ZEGERID) 40-1100 MG per capsule Take 1 capsule by mouth daily before  breakfast.      . Flaxseed, Linseed, (FLAX SEEDS PO) Take 1 tablet by mouth daily.      . Pyridoxine HCl (VITAMIN B6 PO) Take 1 tablet by mouth daily.      . sucralfate (CARAFATE) 1 GM/10ML suspension Take 10 mLs (1 g total) by mouth 4 (four) times daily -  with meals and at bedtime.  420 mL  0   No facility-administered medications prior to visit.   Allergies  Allergen Reactions  . Meperidine Hcl     REACTION: nausea, vomiting  . Nystatin     REACTION: itching, hives   Patient Active Problem List   Diagnosis Date Noted  . Chest pain 02/23/2012  . Depression 02/23/2012  . OBESITY 01/07/2011  . HYPERTENSION 01/07/2011  . ASTHMA, UNSPECIFIED 01/07/2011  . ARTHRITIS 01/07/2011  . NEPHROLITHIASIS, HX OF 01/07/2011  . GERD 01/05/2011  . CONSTIPATION 01/05/2011  . IRRITABLE BOWEL SYNDROME 01/05/2011  . FATTY LIVER DISEASE 01/05/2011   History  Substance Use Topics  . Smoking status: Never Smoker   . Smokeless tobacco: Not on file  . Alcohol Use: Not on file   family history is not on file. + for breast cancer cousin     Objective:   Physical Exam  well-developed African American female in no acute distress, pleasant blood pressure 112/80 pulse 90 one height 5 foot 3 weight 200. HEENT; nontraumatic normocephalic EOMI PERRLA sclera anicteric, Supple ;no JVD, Cardiovascular; regular rate and rhythm with S1-S2 no murmur or gallop,  Pulm; clear bilaterally, Abdomen ;soft nontender nondistended bowel sounds are active, Rectal; exam she has a partially thrombosed external hemorrhoid with oozing of blood. Hemorrhoid is tender but she is able to tolerate a digital exam she does have some swelling of the hemorrhoid into the anal canal., Ext; no clubbing cyanosis or edema skin warm and dry, Psych; mood and affect appropriate.        Assessment & Plan:  #391  43 year old female with a partially thrombosed external hemorrhoid with intermittent bleeding.  #2 hypertension #3 fatty liver  disease #4 GERD  Plan; Patient will continue Proctosol cream 3-4 times daily externally and apply 1 inch inside the rectum Add recta care/5% lidocaine cream 45 times daily as needed for pain Advised sitz baths at least twice daily for 20 minutes at a time over the next 4-5 days until feeling significantly better Moistened wipes Offered referral to [facility] however this hemorrhoid is just partially thrombosed and may resolve without any intervention. She is asked to call back if her symptoms have not significantly improved in for 5 days and/or if her pain acutely worsens at any time. She will be established with Dr. Christella HartiganJacobs.

## 2014-04-09 NOTE — Patient Instructions (Signed)
Use the Proctozone cream.  Use the samples of the Recticare with Lidocaine  As aften as you need too. Take  Sitz baths 2-3 tiems a day.  Take Tylenol or Naproxen for pain.  Give it the rest of this week and call if the pain doesn't resolve.  We have given you a hemorrhoid brochure.

## 2014-04-09 NOTE — Telephone Encounter (Signed)
Patient does not have a preference for new GI MD. She is having hemorrhoid pain that started last week. Her PCP gave her hydrocortisone cream and she has been using this. She was referred for surgical consult but cannot be seen until 04/25/14. She was told to call her GI MD. She is now having bright, red blood with stool and when wiping. She reports a stinging pain when she walks or hs bowel movement. Scheduled with Mike GipAmy Esterwood, PA today at 3:00 PM.

## 2014-04-25 ENCOUNTER — Ambulatory Visit (INDEPENDENT_AMBULATORY_CARE_PROVIDER_SITE_OTHER): Payer: BC Managed Care – PPO | Admitting: General Surgery

## 2014-08-07 ENCOUNTER — Other Ambulatory Visit: Payer: Self-pay

## 2014-08-07 DIAGNOSIS — Z1231 Encounter for screening mammogram for malignant neoplasm of breast: Secondary | ICD-10-CM

## 2014-09-13 ENCOUNTER — Ambulatory Visit
Admission: RE | Admit: 2014-09-13 | Discharge: 2014-09-13 | Disposition: A | Discharge status: Home / Self Care | Payer: BC Managed Care – PPO | Source: Ambulatory Visit

## 2014-09-13 DIAGNOSIS — Z1231 Encounter for screening mammogram for malignant neoplasm of breast: Secondary | ICD-10-CM

## 2014-11-04 ENCOUNTER — Other Ambulatory Visit: Payer: Self-pay | Admitting: Physician Assistant

## 2014-11-04 DIAGNOSIS — R519 Headache, unspecified: Secondary | ICD-10-CM

## 2014-11-04 DIAGNOSIS — R51 Headache: Principal | ICD-10-CM

## 2014-11-12 ENCOUNTER — Ambulatory Visit
Admission: RE | Admit: 2014-11-12 | Discharge: 2014-11-12 | Disposition: A | Discharge status: Home / Self Care | Payer: BC Managed Care – PPO | Source: Ambulatory Visit | Attending: Physician Assistant | Admitting: Physician Assistant

## 2014-11-12 DIAGNOSIS — R51 Headache: Principal | ICD-10-CM

## 2014-11-12 DIAGNOSIS — R519 Headache, unspecified: Secondary | ICD-10-CM

## 2014-11-12 MED ORDER — GADOBENATE DIMEGLUMINE 529 MG/ML IV SOLN
19.0000 mL | Freq: Once | INTRAVENOUS | Status: AC | PRN
  Administered 2014-11-12: 19 mL via INTRAVENOUS

## 2014-11-14 ENCOUNTER — Encounter: Payer: Self-pay | Admitting: *Deleted

## 2014-11-15 ENCOUNTER — Ambulatory Visit (INDEPENDENT_AMBULATORY_CARE_PROVIDER_SITE_OTHER): Payer: BC Managed Care – PPO | Admitting: Neurology

## 2014-11-15 ENCOUNTER — Encounter: Payer: Self-pay | Admitting: Neurology

## 2014-11-15 VITALS — BP 127/87 | HR 75 | Resp 14 | Ht 64.25 in | Wt 199.0 lb

## 2014-11-15 DIAGNOSIS — H8110 Benign paroxysmal vertigo, unspecified ear: Secondary | ICD-10-CM

## 2014-11-15 DIAGNOSIS — G4730 Sleep apnea, unspecified: Secondary | ICD-10-CM

## 2014-11-15 DIAGNOSIS — R0683 Snoring: Secondary | ICD-10-CM

## 2014-11-15 DIAGNOSIS — R351 Nocturia: Secondary | ICD-10-CM | POA: Insufficient documentation

## 2014-11-15 DIAGNOSIS — F5102 Adjustment insomnia: Secondary | ICD-10-CM | POA: Insufficient documentation

## 2014-11-15 MED ORDER — ZOLPIDEM TARTRATE 5 MG PO TABS: 5.0000 mg | ORAL_TABLET | Freq: Every evening | ORAL | Status: DC | PRN

## 2014-11-15 MED ORDER — PROPRANOLOL HCL ER 60 MG PO CP24: 60.0000 mg | ORAL_CAPSULE | Freq: Every day | ORAL | Status: DC

## 2014-11-15 NOTE — Patient Instructions (Signed)
SLEEP MEDICINE CLINIC   Provider:  Melvyn Novasarmen  Jamin Panther, M D  Referring Provider: Clayborn Heronankins, Victoria R, MD Primary Care Physician:  Beverley FiedlerANKINS,VICTORIA, MD  Chief Complaint  Patient presents with  . N Headaches ([place])    Rm 11, husband    HPI:  Andrea Trimble Rhodes is a 43 y.o. afro-american, married, right handed  female , mother of 2 and seen here as a referral  from Dr. Barbaraann Barthelankins for a possibly sleep related headache.  Andrea Rhodes reports that she has frequent headaches she used to go to bed with her headaches her headaches wake her up at night some nights she just wakes up in the headaches are present but other nights the headaches are the cause of her waking up. She also reports that in the morning the headaches are still present. She has not had restorative or refreshing sleep in a long time.  She noted that she slept very well doing a vacation to [place] the summer, but that immediately once  back at work and back in school her sleep was fragmented again. She also has noted a popping sound in the back of her neck that occurs with some certain movements and seems to bring on a vertigo sensation as if the room is spinning around her.  When she closes her eyes during these spells she noticed a room spinning, counterclockwise.  She tries to sit still or be in a quiet position to overcome the proximal spell. During  the spells she also has some blurring of vision noted , but  she denies any diplopia or visual field restriction.  A recent eye exam showed no abnormalities. Her third complaint is a memory problem she describes herself as a very organized person, but she has recently developed some word finding troubles.  At times a word or name of a person  just doesn't come to her so she will point to  the object in question or she can circumferentially describe what she needs without finding the acurate name. The vertebral come to her later with some delay.  She takes SOMA for pain, and often feels  this allows her some sleep, takes it  earlier than usual but the medication still causes grogginess in AM.  This drowsiness and tiredness causes her to not participate in social activities, her work has been affected by it.   Without medication she goes to bed at 9.30 and falls promptly , but will wake 2-3 times worried and her mid is racing, she will wake up at 6.30 and wakes spontaneously . With SOMA, she goes to bed at 10, the alarm wakes her , she would sleep through. She is fatigued in AM. No bathroom breaks. She snores, she may choke , has a postnasal drip, GERD and  she has dull headaches - behind the eyes, Takes almost daily Ibuprofen.   Review of Systems: Out of a complete 14 system review, the patient complains of only the following symptoms, and all other reviewed systems are negative.    How likely are you to doze in the following situations: 0 = not likely, 1 = slight chance, 2 = moderate chance, 3 = high chance  Sitting and Reading? 2 Watching Television?  3 Sitting inactive in a public place (theater or meeting)? 2 Lying down in the afternoon when circumstances permit? 2 Sitting and talking to someone? 0 Sitting quietly after lunch without alcohol? 1 In a car, while stopped for a few minutes in traffic? 0 As a  passenger in a car for an hour without a break? 1  Total = 11,  FSS 45 ,  Depression n/a  I feel cross' irritable.    History   Social History  . Marital Status: Married    Spouse Name: N/A    Number of Children: N/A  . Years of Education: N/A   Occupational History  . Not on file.   Social History Main Topics  . Smoking status: Never Smoker   . Smokeless tobacco: Not on file  . Alcohol Use: No  . Drug Use: No  . Sexual Activity: Not on file   Other Topics Concern  . Not on file   Social History Narrative   Caffeine 3 x week, FT Medical Coding compliance auditor at [employer], Married, 2 kids.      History reviewed. No pertinent family  history.  Past Medical History  Diagnosis Date  . HYPERTENSION   . OBESITY   . NEPHROLITHIASIS, HX OF   . Irritable bowel syndrome   . GERD   . FATTY LIVER DISEASE   . CONSTIPATION   . ASTHMA, UNSPECIFIED   . ARTHRITIS     Past Surgical History  Procedure Laterality Date  . Laparoscopy 1984    . Bladder surgery      Current Outpatient Prescriptions  Medication Sig Dispense Refill  . carisoprodol (SOMA) 350 MG tablet Take 350 mg by mouth at bedtime.    . Cholecalciferol (VITAMIN D) 2000 UNITS CAPS Take 1 capsule by mouth daily.    . hydrochlorothiazide (HYDRODIURIL) 25 MG tablet Take 12.5 mg by mouth daily.     . Ivermectin (SOOLANTRA) 1 % CREA Apply 1 application topically daily.    . omeprazole-sodium bicarbonate (ZEGERID) 40-1100 MG per capsule Take 1 capsule by mouth daily before breakfast.    . spironolactone (ALDACTONE) 50 MG tablet Take 50 mg by mouth daily.    . albuterol (PROVENTIL HFA;VENTOLIN HFA) 108 (90 BASE) MCG/ACT inhaler Inhale 2 puffs into the lungs every 4 (four) hours as needed for wheezing or shortness of breath.    . hydrocortisone (ANUSOL-HC) 2.5 % rectal cream Place 1 application rectally 2 (two) times daily as needed for hemorrhoids or itching.     No current facility-administered medications for this visit.    Allergies as of 11/15/2014 - Review Complete 11/15/2014  Allergen Reaction Noted  . Meperidine hcl  01/07/2011  . Nystatin  01/07/2011  . Other  11/15/2014    Vitals: BP 127/87 mmHg  Pulse 75  Resp 14  Ht 5' 4.25" (1.632 m)  Wt 199 lb (90.266 kg)  BMI 33.89 kg/m2 Last Weight:  Wt Readings from Last 1 Encounters:  11/15/14 199 lb (90.266 kg)       Last Height:   Ht Readings from Last 1 Encounters:  11/15/14 5' 4.25" (1.632 m)    Physical exam:  General: The patient is awake, alert and appears not in acute distress. The patient is well groomed. Head: Normocephalic, atraumatic. Neck is supple. Mallampati 3  neck circumference:  16. Nasal airflow unrestricted , TMJ is not evident . Retrognathia is mild.  Cardiovascular:  Regular rate and rhythm , without  murmurs or carotid bruit, and without distended neck veins. Respiratory: Lungs are clear to auscultation. Skin:  Without evidence of edema, or rash Trunk: BMI is  elevated and patient  has normal posture.  Neurologic exam : The patient is awake and alert, oriented to place and time.   Memory subjective described   as intact.  There is a normal attention span & concentration ability. Speech is fluent without dysarthria, dysphonia or aphasia.  Mood and affect are appropriate.  Cranial nerves: Pupils are equal and briskly reactive to light. Funduscopic exam without evidence of pallor or edema.  Extraocular movements  in vertical and horizontal planes intact and without nystagmus. Visual fields by finger perimetry are intact. Hearing to finger rub intact.  Facial sensation intact to fine touch. Facial motor strength is symmetric and tongue and uvula move midline.  Motor exam:   Normal tone, muscle bulk and symmetric strength in all extremities.  Sensory:  Fine touch, pinprick and vibration were tested in all extremities.  Proprioception is  normal.  Coordination: Rapid alternating movements in the fingers/hands is normal.  Finger-to-nose maneuver  normal without evidence of ataxia, dysmetria or tremor.  Gait and station: Patient walks without assistive device and is able unassisted to climb up to the exam table. Strength within normal limits. Stance is stable and normal.   CLINICAL DATA: Evaluate daily headaches that awaken patient from sleep. Dizziness with memory issues. Blurred vision. Initial encounter.  EXAM: MRI HEAD WITHOUT AND WITH CONTRAST  TECHNIQUE: Multiplanar, multiecho pulse sequences of the brain and surrounding structures were obtained without and with intravenous contrast.  CONTRAST: 19mL MULTIHANCE GADOBENATE DIMEGLUMINE 529 MG/ML IV  SOLN  COMPARISON: CT head 01/19/2008.  FINDINGS: No evidence for acute infarction, hemorrhage, mass lesion, hydrocephalus, or extra-axial fluid. There is no atrophy or white matter disease. There is no atrophy or white matter disease. Flow voids are maintained throughout the carotid, basilar, and vertebral arteries. There are no areas of chronic hemorrhage. Pituitary, pineal, and cerebellar tonsils unremarkable. No upper cervical lesions. Visualized calvarium, skull base, and upper cervical osseous structures unremarkable. Scalp and extracranial soft tissues, orbits, sinuses, and mastoids show no acute process.  Post infusion, no abnormal enhancement of the brain or meninges. No change from prior normal CT.  IMPRESSION: Negative exam. No acute intracranial findings.   Electronically Signed  By: Davonna BellingJohn Curnes M.D.  On: 11/12/2014 10:09        Assessment:  After physical and neurologic examination, review of laboratory studies, imaging, neurophysiology testing and pre-existing records, assessment is   1)  Poorly rested patient, with witnessed snoring, retrognathia and postnasal drip. I suspect OSA. Measure CO2.  2)  Memory difficulties are delayed word finding, worse after taking soma- medication related or poor sleep related.  3)  Vertigo , headache related- evaluate for vertebro- basilar flow.  4)  Headaches accompanied by 3 caffeine containing beverages, poor sleep, and fatigue.     The patient was advised of the nature of the diagnosed sleep disorder , the treatment options and risks for general a health and wellness arising from not treating the condition. Visit duration was 45 minutes.   Plan:  Treatment plan and additional workup :  SPLIT with Co2 , reduce SOMA and Ibuprofen , start Propanolol  XR at night.  ambien prn, 5 mg.      Porfirio Mylararmen Caidin Heidenreich MD  11/15/2014

## 2014-11-19 ENCOUNTER — Ambulatory Visit (INDEPENDENT_AMBULATORY_CARE_PROVIDER_SITE_OTHER): Payer: BC Managed Care – PPO | Admitting: Neurology

## 2014-11-19 DIAGNOSIS — R0683 Snoring: Secondary | ICD-10-CM

## 2014-11-19 DIAGNOSIS — R351 Nocturia: Secondary | ICD-10-CM

## 2014-11-19 DIAGNOSIS — G4730 Sleep apnea, unspecified: Secondary | ICD-10-CM

## 2014-11-19 DIAGNOSIS — F5102 Adjustment insomnia: Secondary | ICD-10-CM

## 2014-11-19 NOTE — Progress Notes (Signed)
SLEEP MEDICINE CLINIC   Provider:  Melvyn Novasarmen  Kypton Eltringham, M D  Referring Provider: Clayborn Heronankins, Victoria R, MD Primary Care Physician:  Beverley FiedlerANKINS,VICTORIA, MD  Chief Complaint  Patient presents with  . N Headaches ([place])    Rm 11, husband    HPI:  Andrea Rhodes is a 43 y.o. afro-american, married, right handed  female , mother of 2 and seen here as a referral  from Dr. Barbaraann Barthelankins for a possibly sleep related headache.  Mrs. Rhodes reports that she has frequent headaches she used to go to bed with her headaches her headaches wake her up at night some nights she just wakes up in the headaches are present but other nights the headaches are the cause of her waking up. She also reports that in the morning the headaches are still present. She has not had restorative or refreshing sleep in a long time.  She noted that she slept very well doing a vacation to [place] the summer, but that immediately once  back at work and back in school her sleep was fragmented again. She also has noted a popping sound in the back of her neck that occurs with some certain movements and seems to bring on a vertigo sensation as if the room is spinning around her.  When she closes her eyes during these spells she noticed a room spinning, counterclockwise.  She tries to sit still or be in a quiet position to overcome the proximal spell. During  the spells she also has some blurring of vision noted , but  she denies any diplopia or visual field restriction.  A recent eye exam showed no abnormalities. Her third complaint is a memory problem she describes herself as a very organized person, but she has recently developed some word finding troubles.  At times a word or name of a person  just doesn't come to her so she will point to  the object in question or she can circumferentially describe what she needs without finding the acurate name. The vertebral come to her later with some delay.  She takes SOMA for pain, and often feels  this allows her some sleep, takes it  earlier than usual but the medication still causes grogginess in AM.  This drowsiness and tiredness causes her to not participate in social activities, her work has been affected by it.   Without medication she goes to bed at 9.30 and falls promptly , but will wake 2-3 times worried and her mid is racing, she will wake up at 6.30 and wakes spontaneously . With SOMA, she goes to bed at 10, the alarm wakes her , she would sleep through. She is fatigued in AM. No bathroom breaks. She snores, she may choke , has a postnasal drip, GERD and  she has dull headaches - behind the eyes, Takes almost daily Ibuprofen.   Review of Systems: Out of a complete 14 system review, the patient complains of only the following symptoms, and all other reviewed systems are negative.    How likely are you to doze in the following situations: 0 = not likely, 1 = slight chance, 2 = moderate chance, 3 = high chance  Sitting and Reading? 2 Watching Television?  3 Sitting inactive in a public place (theater or meeting)? 2 Lying down in the afternoon when circumstances permit? 2 Sitting and talking to someone? 0 Sitting quietly after lunch without alcohol? 1 In a car, while stopped for a few minutes in traffic? 0 As a  passenger in a car for an hour without a break? 1  Total = 11,  FSS 45 ,  Depression n/a  I feel cross' irritable.    History   Social History  . Marital Status: Married    Spouse Name: N/A    Number of Children: N/A  . Years of Education: N/A   Occupational History  . Not on file.   Social History Main Topics  . Smoking status: Never Smoker   . Smokeless tobacco: Not on file  . Alcohol Use: No  . Drug Use: No  . Sexual Activity: Not on file   Other Topics Concern  . 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      History reviewed. No pertinent family  history.  Past Medical History  Diagnosis Date  . HYPERTENSION   . OBESITY   . NEPHROLITHIASIS, HX OF   . Irritable bowel syndrome   . GERD   . FATTY LIVER DISEASE   . CONSTIPATION   . ASTHMA, UNSPECIFIED   . ARTHRITIS     Past Surgical History  Procedure Laterality Date  . Laparoscopy 1984    . Bladder surgery      Current Outpatient Prescriptions  Medication Sig Dispense Refill  . carisoprodol (SOMA) 350 MG tablet Take 350 mg by mouth at bedtime.    . Cholecalciferol (VITAMIN D) 2000 UNITS CAPS Take 1 capsule by mouth daily.    . hydrochlorothiazide (HYDRODIURIL) 25 MG tablet Take 12.5 mg by mouth daily.     . Ivermectin (SOOLANTRA) 1 % CREA Apply 1 application topically daily.    Marland Kitchen. omeprazole-sodium bicarbonate (ZEGERID) 40-1100 MG per capsule Take 1 capsule by mouth daily before breakfast.    . spironolactone (ALDACTONE) 50 MG tablet Take 50 mg by mouth daily.    Marland Kitchen. albuterol (PROVENTIL HFA;VENTOLIN HFA) 108 (90 BASE) MCG/ACT inhaler Inhale 2 puffs into the lungs every 4 (four) hours as needed for wheezing or shortness of breath.    . hydrocortisone (ANUSOL-HC) 2.5 % rectal cream Place 1 application rectally 2 (two) times daily as needed for hemorrhoids or itching.     No current facility-administered medications for this visit.    Allergies as of 11/15/2014 - Review Complete 11/15/2014  Allergen Reaction Noted  . Meperidine hcl  01/07/2011  . Nystatin  01/07/2011  . Other  11/15/2014    Vitals: BP 127/87 mmHg  Pulse 75  Resp 14  Ht 5' 4.25" (1.632 m)  Wt 199 lb (90.266 kg)  BMI 33.89 kg/m2 Last Weight:  Wt Readings from Last 1 Encounters:  11/15/14 199 lb (90.266 kg)       Last Height:   Ht Readings from Last 1 Encounters:  11/15/14 5' 4.25" (1.632 m)    Physical exam:  General: The patient is awake, alert and appears not in acute distress. The patient is well groomed. Head: Normocephalic, atraumatic. Neck is supple. Mallampati 3  neck circumference:  16. Nasal airflow unrestricted , TMJ is not evident . Retrognathia is mild.  Cardiovascular:  Regular rate and rhythm , without  murmurs or carotid bruit, and without distended neck veins. Respiratory: Lungs are clear to auscultation. Skin:  Without evidence of edema, or rash Trunk: BMI is  elevated and patient  has normal posture.  Neurologic exam : The patient is awake and alert, oriented to place and time.   Memory subjective described  as intact.  There is a normal attention span & concentration ability. Speech is fluent without dysarthria, dysphonia or aphasia.  Mood and affect are appropriate.  Cranial nerves: Pupils are equal and briskly reactive to light. Funduscopic exam without evidence of pallor or edema.  Extraocular movements  in vertical and horizontal planes intact and without nystagmus. Visual fields by finger perimetry are intact. Hearing to finger rub intact.  Facial sensation intact to fine touch. Facial motor strength is symmetric and tongue and uvula move midline.  Motor exam:   Normal tone, muscle bulk and symmetric strength in all extremities.  Sensory:  Fine touch, pinprick and vibration were tested in all extremities.  Proprioception is  normal.  Coordination: Rapid alternating movements in the fingers/hands is normal.  Finger-to-nose maneuver  normal without evidence of ataxia, dysmetria or tremor.  Gait and station: Patient walks without assistive device and is able unassisted to climb up to the exam table. Strength within normal limits. Stance is stable and normal.   CLINICAL DATA: Evaluate daily headaches that awaken patient from sleep. Dizziness with memory issues. Blurred vision. Initial encounter.  EXAM: MRI HEAD WITHOUT AND WITH CONTRAST  TECHNIQUE: Multiplanar, multiecho pulse sequences of the brain and surrounding structures were obtained without and with intravenous contrast.  CONTRAST: 19mL MULTIHANCE GADOBENATE DIMEGLUMINE 529 MG/ML IV  SOLN  COMPARISON: CT head 01/19/2008.  FINDINGS: No evidence for acute infarction, hemorrhage, mass lesion, hydrocephalus, or extra-axial fluid. There is no atrophy or white matter disease. There is no atrophy or white matter disease. Flow voids are maintained throughout the carotid, basilar, and vertebral arteries. There are no areas of chronic hemorrhage. Pituitary, pineal, and cerebellar tonsils unremarkable. No upper cervical lesions. Visualized calvarium, skull base, and upper cervical osseous structures unremarkable. Scalp and extracranial soft tissues, orbits, sinuses, and mastoids show no acute process.  Post infusion, no abnormal enhancement of the brain or meninges. No change from prior normal CT.  IMPRESSION: Negative exam. No acute intracranial findings.   Electronically Signed  By: Davonna BellingJohn Curnes M.D.  On: 11/12/2014 10:09        Assessment:  After physical and neurologic examination, review of laboratory studies, imaging, neurophysiology testing and pre-existing records, assessment is   1)  Poorly rested patient, with witnessed snoring, retrognathia and postnasal drip. I suspect OSA. Measure CO2.  2)  Memory difficulties are delayed word finding, worse after taking soma- medication related or poor sleep related.  3)  Vertigo , headache related- evaluate for vertebro- basilar flow.  4)  Headaches accompanied by 3 caffeine containing beverages, poor sleep, and fatigue.     The patient was advised of the nature of the diagnosed sleep disorder , the treatment options and risks for general a health and wellness arising from not treating the condition. Visit duration was 45 minutes.   Plan:  Treatment plan and additional workup :  SPLIT with Co2 , reduce SOMA and Ibuprofen , start Propanolol  XR at night.  ambien prn, 5 mg.      Porfirio Mylararmen Ricco Dershem MD  11/15/2014

## 2014-11-20 NOTE — Sleep Study (Signed)
Please see the scanned sleep study interpretation located in the Procedure tab within the chart review section 

## 2014-11-27 ENCOUNTER — Other Ambulatory Visit: Payer: Self-pay | Admitting: *Deleted

## 2014-11-27 ENCOUNTER — Telehealth: Payer: Self-pay | Admitting: Neurology

## 2014-11-27 DIAGNOSIS — F5102 Adjustment insomnia: Secondary | ICD-10-CM

## 2014-11-27 DIAGNOSIS — H8110 Benign paroxysmal vertigo, unspecified ear: Secondary | ICD-10-CM

## 2014-11-27 DIAGNOSIS — G4730 Sleep apnea, unspecified: Secondary | ICD-10-CM

## 2014-11-27 NOTE — Telephone Encounter (Signed)
Called patient and left message of her appointments for her dopp[facility] at [facility] requested by Dr. Vickey Hugerohmeier. appt is scheduled for 12/03/14 @ 11:00 am I also left her the location and contact number [phone] if she needs to make any changes to the appointment.

## 2014-11-27 NOTE — Progress Notes (Signed)
Re did order for [facility].  VAS 1072, and VAS 01.

## 2014-12-03 ENCOUNTER — Ambulatory Visit (HOSPITAL_COMMUNITY)
Admission: RE | Admit: 2014-12-03 | Discharge: 2014-12-03 | Disposition: A | Discharge status: Home / Self Care | Payer: BC Managed Care – PPO | Source: Ambulatory Visit | Attending: Neurology | Admitting: Neurology

## 2014-12-03 ENCOUNTER — Telehealth: Payer: Self-pay | Admitting: *Deleted

## 2014-12-03 ENCOUNTER — Encounter: Payer: Self-pay | Admitting: Neurology

## 2014-12-03 DIAGNOSIS — G4730 Sleep apnea, unspecified: Secondary | ICD-10-CM

## 2014-12-03 DIAGNOSIS — H8110 Benign paroxysmal vertigo, unspecified ear: Secondary | ICD-10-CM

## 2014-12-03 DIAGNOSIS — R42 Dizziness and giddiness: Secondary | ICD-10-CM

## 2014-12-03 DIAGNOSIS — F5102 Adjustment insomnia: Secondary | ICD-10-CM

## 2014-12-03 DIAGNOSIS — I6523 Occlusion and stenosis of bilateral carotid arteries: Secondary | ICD-10-CM | POA: Diagnosis not present

## 2014-12-03 NOTE — Progress Notes (Signed)
VASCULAR LAB PRELIMINARY  PRELIMINARY  PRELIMINARY  PRELIMINARY  Carotid duplex completed.    Preliminary report:  Bilateral:  1-39% ICA stenosis.  Vertebral artery flow is antegrade.     Lalaine Overstreet, RVS 12/03/2014, 4:47 PM

## 2014-12-03 NOTE — Telephone Encounter (Signed)
Patient was contacted and notified via voicemail that her sleep study did not show any significant sleep apnea and that no further follow up was required here with Dr. Vickey Hugerohmeier.  She was instructed to contact the office with any questions or concerns.  Patient was mailed a copy of the test results and Dr. Barbaraann Barthelankins was faxed a copy of the results.

## 2014-12-03 NOTE — Progress Notes (Signed)
VASCULAR LAB PRELIMINARY  PRELIMINARY  PRELIMINARY  PRELIMINARY  Transcranial Doppler  completed.    Preliminary report:  TCD completed  Andrea Rhodes, RVS 12/03/2014, 4:50 PM

## 2015-05-19 ENCOUNTER — Other Ambulatory Visit: Payer: Self-pay

## 2015-05-19 DIAGNOSIS — R0683 Snoring: Secondary | ICD-10-CM

## 2015-05-19 DIAGNOSIS — H8110 Benign paroxysmal vertigo, unspecified ear: Secondary | ICD-10-CM

## 2015-05-19 DIAGNOSIS — F5102 Adjustment insomnia: Secondary | ICD-10-CM

## 2015-05-19 DIAGNOSIS — R351 Nocturia: Secondary | ICD-10-CM

## 2015-05-19 DIAGNOSIS — G4730 Sleep apnea, unspecified: Secondary | ICD-10-CM

## 2015-05-19 MED ORDER — ZOLPIDEM TARTRATE 5 MG PO TABS: 5.0000 mg | ORAL_TABLET | Freq: Every evening | ORAL | Status: DC | PRN

## 2015-05-19 NOTE — Telephone Encounter (Signed)
Rx has been signed and faxed  

## 2015-08-08 ENCOUNTER — Other Ambulatory Visit: Payer: Self-pay

## 2015-08-08 DIAGNOSIS — Z1231 Encounter for screening mammogram for malignant neoplasm of breast: Secondary | ICD-10-CM

## 2015-09-15 ENCOUNTER — Ambulatory Visit
Admission: RE | Admit: 2015-09-15 | Discharge: 2015-09-15 | Disposition: A | Discharge status: Home / Self Care | Payer: 59 | Source: Ambulatory Visit

## 2015-09-15 DIAGNOSIS — Z1231 Encounter for screening mammogram for malignant neoplasm of breast: Secondary | ICD-10-CM

## 2015-10-27 ENCOUNTER — Telehealth: Payer: Self-pay | Admitting: Neurology

## 2015-10-27 NOTE — Telephone Encounter (Signed)
I advised pt that per Dr. Oliva Bustardohmeier's interpretation of her sleep study, a follow up appt with Dr. Vickey Hugerohmeier is not needed. I advised her that Dr. Vickey Hugerohmeier would want Dr. Luciana Axeankin to take over writing for her Remus Lofflerambien. Pt states that this is what she wants anyways. I advised pt to let our office know if Dr. Luciana Axeankin needs any records from us. Pt verbalized understanding.

## 2015-10-27 NOTE — Telephone Encounter (Signed)
Patient is calling and would like to know if her PCP Dr. Barton Andrea Rhodes could take over writing her Rx Zolpiden 5 mg as she has to see her more often.  Please call.

## 2015-10-28 ENCOUNTER — Telehealth: Payer: Self-pay | Admitting: Neurology

## 2015-10-28 NOTE — Telephone Encounter (Signed)
Per sleep study in 2015, follow up with Dr. Vickey Hugerohmeier is not needed, an organic sleep disorder was not identified. Will send to Dr. Vickey Hugerohmeier to see if she is agreeable to refilling pt's ambien since Dr. Luciana Axeankin, PCP is uncomfortable prescribing controlled substances.

## 2015-10-28 NOTE — Telephone Encounter (Signed)
Patient called back and states that her Dr does not want to write a Rx for Remus Lofflerambien because he does not like to prescribe conrolled substances.  She need a reill on her Rx.  Thanks!

## 2015-10-28 NOTE — Telephone Encounter (Signed)
Called to advise pt that Dr. Vickey Hugerohmeier will not prescribe ambien for the pt, since the pt was instructed that no f/u was needed.  No answer, left a message asking her to call me back.

## 2015-10-28 NOTE — Telephone Encounter (Signed)
The patient should not continuously use Ambien even at 5 mg. Ambien is potentially habit forming.  Since 2015 ( last visit ) there was no ambien prescribed through this office.  I have seen the patient once and her sleep study was not revealing organic insomnia reasons.  It is not the score of our  sleep clinic to treat insomnia . Marland Kitchen.   I would recommend trazodone, Silenor, amitriptyline, Flexeril as possible alternatives - non-habit-forming .

## 2015-10-29 NOTE — Telephone Encounter (Signed)
I advised pt that Dr. Vickey Hugerohmeier does not follow the pt in any more and wishes that her PCP take over prescribing this medication. I advised pt that I would fax this telephone note with Dr. Oliva Bustardohmeier's comments and recommendations on it, along with her sleep study, to hopefully clear up any confusion. Pt verbalized understanding.

## 2016-04-20 IMAGING — MR MR HEAD WO/W CM
11 of 12 series · 38 of 48 positions shown · IV contrast (multihance)
Comparison: CT head 01/19/2008.

CLINICAL DATA: Evaluate daily headaches that awaken patient from
sleep. Dizziness with memory issues. Blurred vision. Initial
encounter.

EXAM:
MRI HEAD WITHOUT AND WITH CONTRAST
TECHNIQUE: Multiplanar, multiecho pulse sequences of the brain and surrounding
structures were obtained without and with intravenous contrast.
CONTRAST:  19mL MULTIHANCE GADOBENATE DIMEGLUMINE 529 MG/ML IV SOLN

[Series 2: T1 · sagittal · 5.0mm · 0.45mm/px · 3 of 19 slices shown]
[im 1/19]
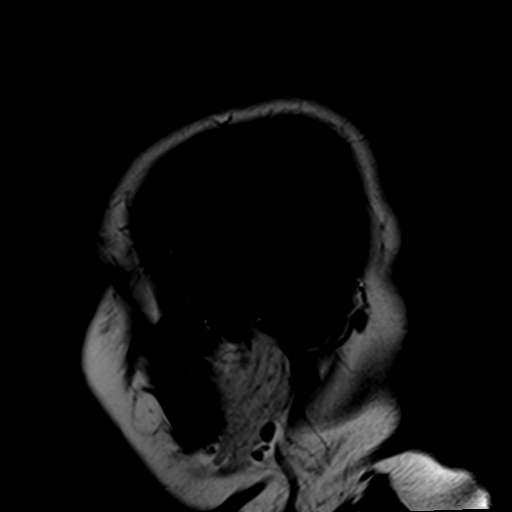
[im 10/19]
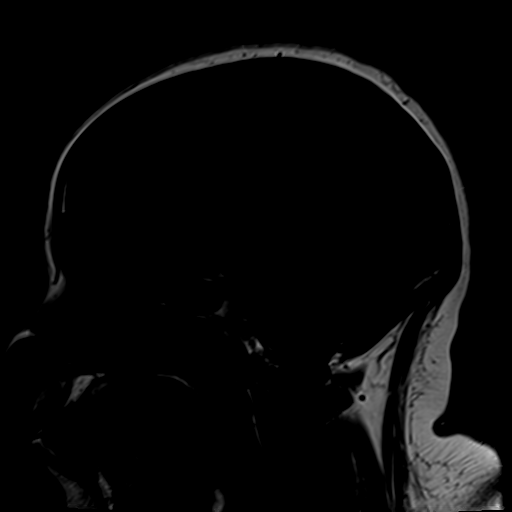
[im 19/19]
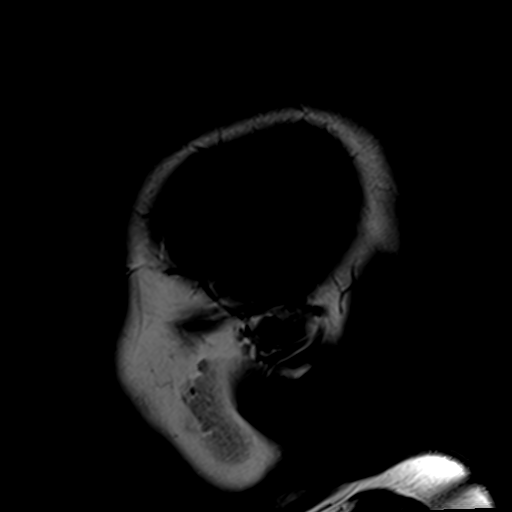

[Series 3: DWI · axial · 5.0mm · 0.90mm/px · z∈[-73,+63]mm · 6 of 44 slices shown (1 of 3)]
[im 1/44]
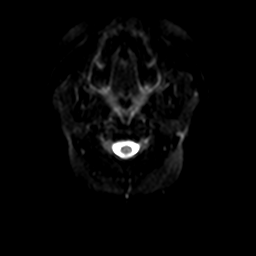
[im 9/44]
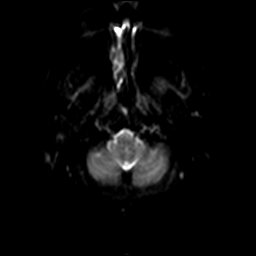
[im 18/44]
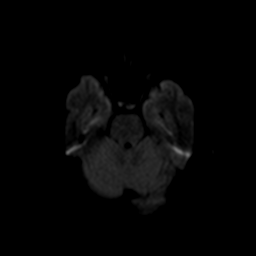
[im 26/44]
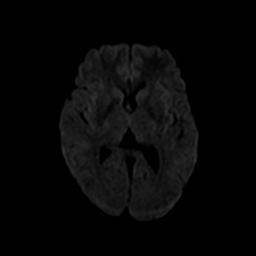
[im 35/44]
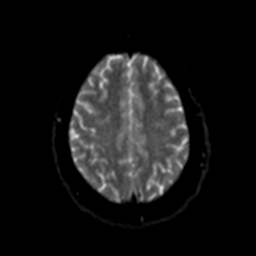
[im 44/44]
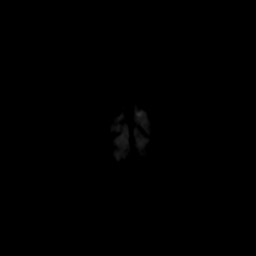

[Series 4: dwi_adc · axial · 5.0mm · 0.90mm/px · z∈[-73,+63]mm · 2 of 22 slices shown]
[im 1/22]
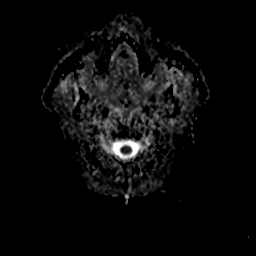
[im 22/22]
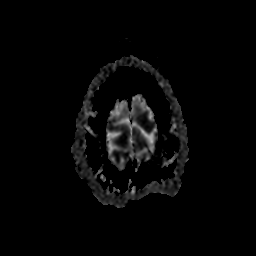

[Series 5: DWI · coronal · 5.0mm · 0.90mm/px · 6 of 56 slices shown (2 of 3)]
[im 1/56]
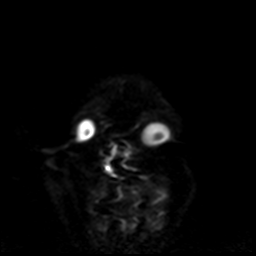
[im 12/56]
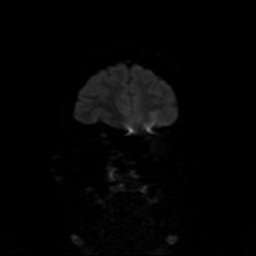
[im 23/56]
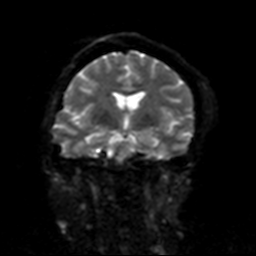
[im 34/56]
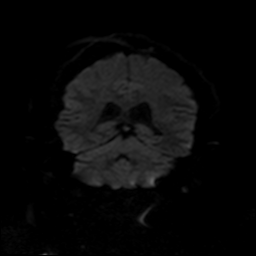
[im 45/56]
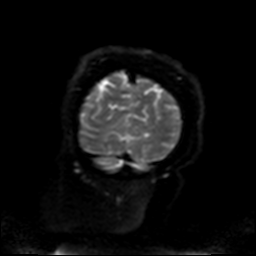
[im 56/56]
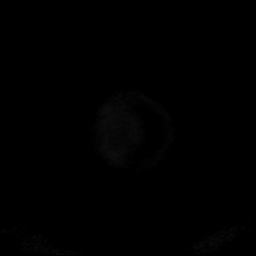

[Series 6: DWI · coronal · 5.0mm · 0.90mm/px · 3 of 28 slices shown (3 of 3)]
[im 1/28]
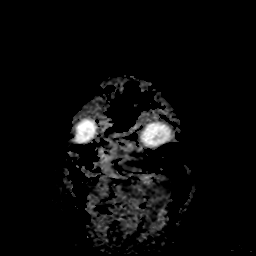
[im 14/28]
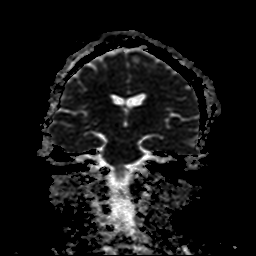
[im 28/28]
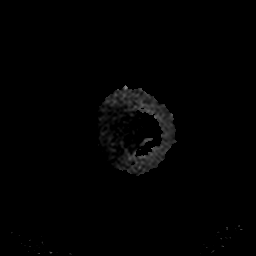

[Series 7: FLAIR · axial · 5.0mm · 0.45mm/px · z∈[-73,+63]mm · 2 of 22 slices shown]
[im 1/22]
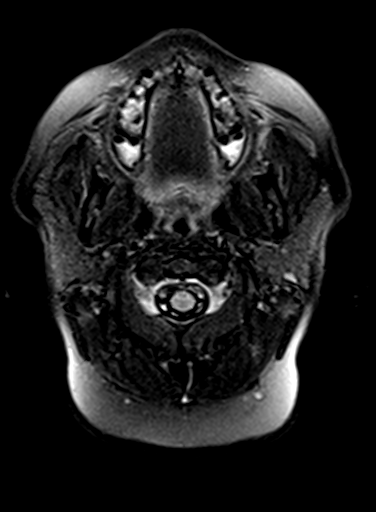
[im 22/22]
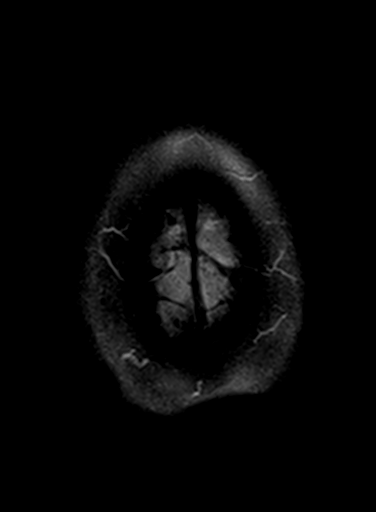

[Series 8: T2 · axial · 5.0mm · 0.45mm/px · z∈[-73,+63]mm · 2 of 22 slices shown (1 of 2)]
[im 1/22]
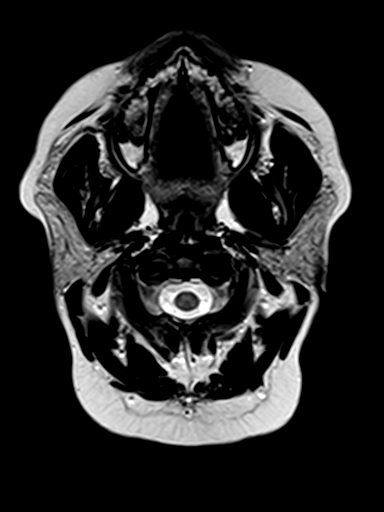
[im 22/22]
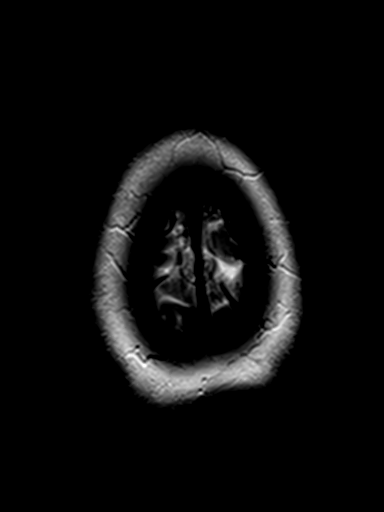

[Series 10: swi_images · axial · 2.2mm · 0.90mm/px · z∈[-71,+61]mm · 6 of 60 slices shown]
[im 1/60]
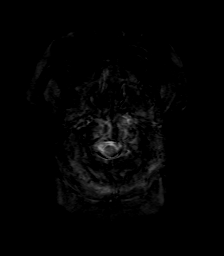
[im 12/60]
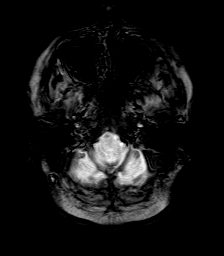
[im 24/60]
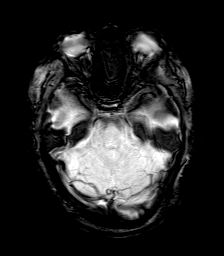
[im 36/60]
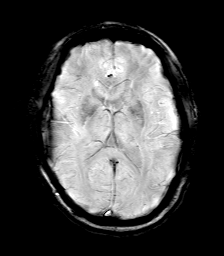
[im 48/60]
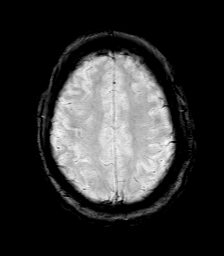
[im 60/60]
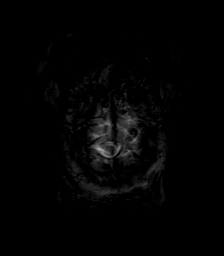

[Series 11: axial (person_name)1 volume · axial · 2.0mm · 0.45mm/px · z∈[-76,-6]mm · 4 of 72 slices shown]
[im 1/72]
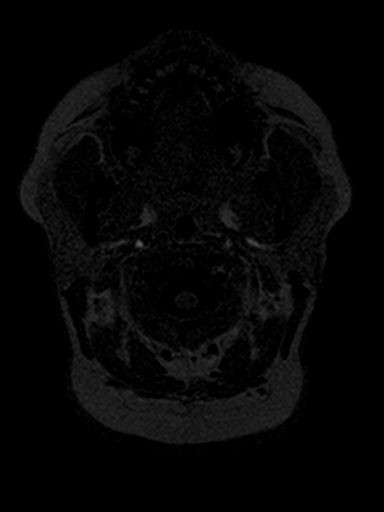
[im 12/72]
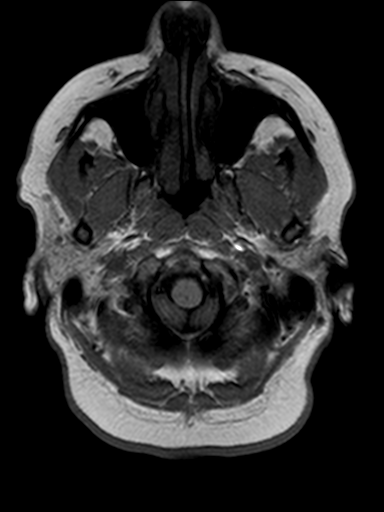
[im 24/72]
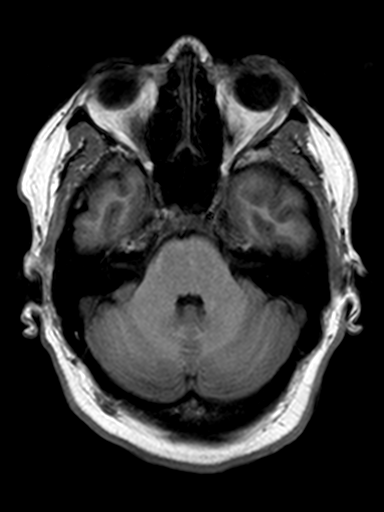
[im 36/72]
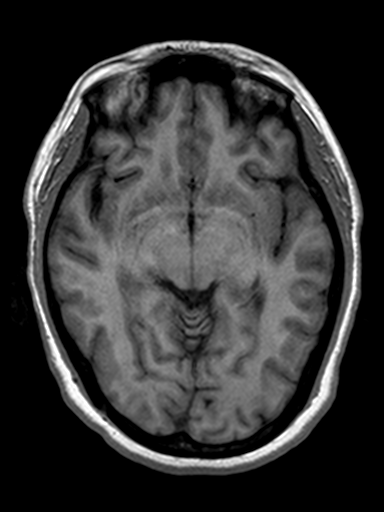

[Series 12: T2 · coronal · 5.0mm · 0.41mm/px · 2 of 24 slices shown (2 of 2)]
[im 1/24]
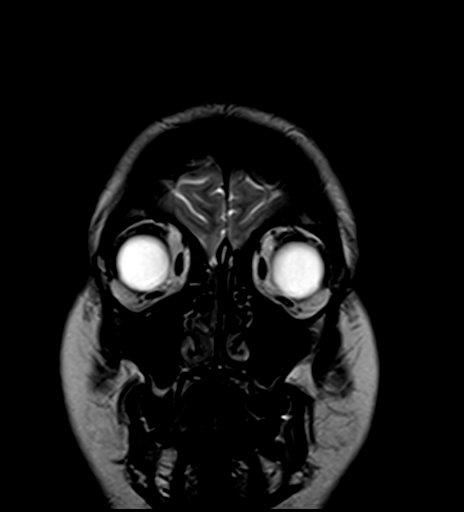
[im 24/24]
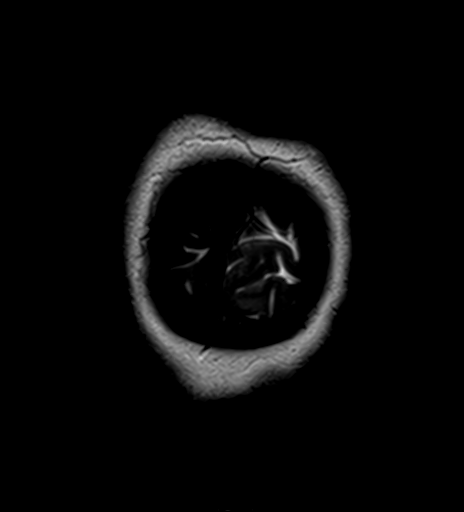

[Series 13: T1 post-contrast · coronal · 5.0mm · 0.41mm/px · 2 of 24 slices shown]
[im 1/24]
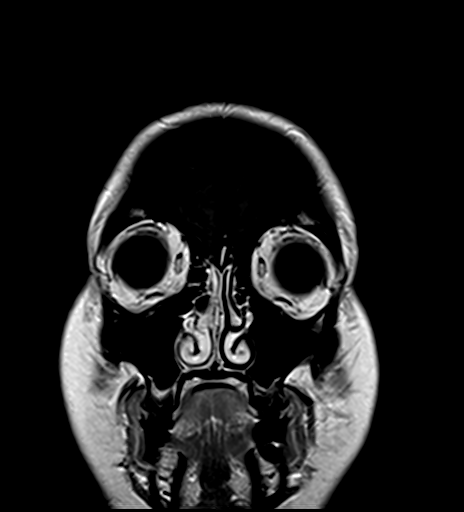
[im 24/24]
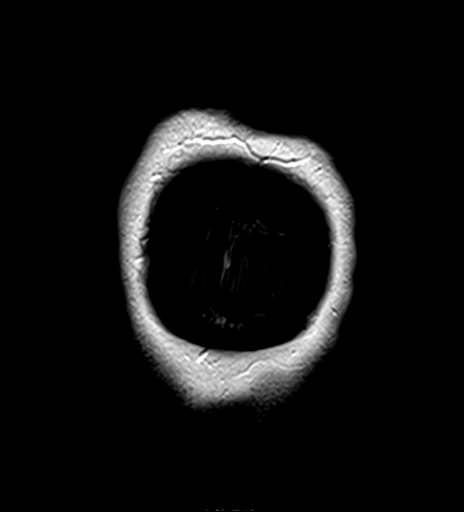

[38 of 48 positions shown; findings below may reference images not displayed]

FINDINGS: No evidence for acute infarction, hemorrhage, mass lesion,
hydrocephalus, or extra-axial fluid. There is no atrophy or white
matter disease. There is no atrophy or white matter disease. Flow
voids are maintained throughout the carotid, basilar, and vertebral
arteries. There are no areas of chronic hemorrhage. Pituitary,
pineal, and cerebellar tonsils unremarkable. No upper cervical
lesions. Visualized calvarium, skull base, and upper cervical
osseous structures unremarkable. Scalp and extracranial soft
tissues, orbits, sinuses, and mastoids show no acute process.

Post infusion, no abnormal enhancement of the brain or meninges. No
change from prior normal CT.
IMPRESSION: Negative exam.  No acute intracranial findings.

## 2016-07-22 ENCOUNTER — Other Ambulatory Visit: Payer: Self-pay | Admitting: Obstetrics and Gynecology

## 2016-07-22 DIAGNOSIS — Z1231 Encounter for screening mammogram for malignant neoplasm of breast: Secondary | ICD-10-CM

## 2016-09-16 ENCOUNTER — Ambulatory Visit
Admission: RE | Admit: 2016-09-16 | Discharge: 2016-09-16 | Disposition: A | Discharge status: Home / Self Care | Payer: 59 | Source: Ambulatory Visit | Attending: Obstetrics and Gynecology | Admitting: Obstetrics and Gynecology

## 2016-09-16 DIAGNOSIS — Z1231 Encounter for screening mammogram for malignant neoplasm of breast: Secondary | ICD-10-CM

## 2016-10-18 ENCOUNTER — Telehealth: Payer: Self-pay | Admitting: Physician Assistant

## 2016-10-18 ENCOUNTER — Other Ambulatory Visit: Payer: Self-pay

## 2016-10-18 MED ORDER — HYDROCORTISONE ACETATE 25 MG RE SUPP: 25.0000 mg | Freq: Two times a day (BID) | RECTAL | 1 refills | Status: DC

## 2016-10-18 NOTE — Telephone Encounter (Signed)
Spoke with the patient. She is leaving town to see her grandmother who is sick. History of hemorrhoids. She has been treating with RectiCare. Her symptoms are rectal itching and burning. She has had spotting with bowel movements. Can she have Anusol hc suppositories? Office visit when she comes back. Urgent Care if she worsens.

## 2016-10-18 NOTE — Telephone Encounter (Signed)
Patient states that she will be "in flight" after 4:30pm and is requesting a callback before then.  Best # [phone]

## 2016-10-19 ENCOUNTER — Telehealth: Payer: Self-pay | Admitting: Physician Assistant

## 2016-10-19 ENCOUNTER — Other Ambulatory Visit: Payer: Self-pay

## 2016-10-19 MED ORDER — HYDROCORTISONE 2.5 % RE CREA: 1.0000 "application " | TOPICAL_CREAM | Freq: Two times a day (BID) | RECTAL | 1 refills | Status: DC | PRN

## 2016-10-19 NOTE — Telephone Encounter (Signed)
Replaced with a previously covered medication Anusol HC cream. Patient is aware. Left a message on the pharmacy line asking the suppository Rx be cancelled and replaced with the cream.

## 2016-10-19 NOTE — Telephone Encounter (Signed)
Yes.Marland Kitchen.Marland Kitchen.Anusol HC supp, one box/1 refill

## 2016-10-22 ENCOUNTER — Telehealth: Payer: Self-pay | Admitting: Physician Assistant

## 2016-10-22 NOTE — Telephone Encounter (Signed)
Patient has treated what she feels is an internal hemorrhoid with Recticare and Anusol-HC cream. She has pushed the Anusol_HC cream internally to the inside of her rectal opening. She doesn't feel it is improving and is having continued bleeding. Sometimes she can feel a "knot".  Agrees to come in for evaluation.

## 2016-10-25 ENCOUNTER — Encounter: Payer: Self-pay | Admitting: Physician Assistant

## 2016-10-25 ENCOUNTER — Telehealth: Payer: Self-pay | Admitting: Physician Assistant

## 2016-10-25 ENCOUNTER — Ambulatory Visit (INDEPENDENT_AMBULATORY_CARE_PROVIDER_SITE_OTHER): Payer: 59 | Admitting: Physician Assistant

## 2016-10-25 VITALS — BP 132/96 | Ht 62.75 in | Wt 201.2 lb

## 2016-10-25 DIAGNOSIS — B372 Candidiasis of skin and nail: Secondary | ICD-10-CM | POA: Diagnosis not present

## 2016-10-25 DIAGNOSIS — K645 Perianal venous thrombosis: Secondary | ICD-10-CM

## 2016-10-25 MED ORDER — NYSTATIN 100000 UNIT/GM EX POWD
Freq: Two times a day (BID) | CUTANEOUS | 0 refills | Status: DC
Start: 2016-10-25 — End: 2020-06-17

## 2016-10-25 NOTE — Progress Notes (Signed)
I agree with the above note, plan 

## 2016-10-25 NOTE — Progress Notes (Signed)
Chief Complaint: Rectal itching and bleeding  HPI:  Andrea Rhodes is a 45 year old African-American female with a history of arthritis, asthma, constipation, fatty liver disease, fibromyalgia, GERD, hypertension, IBS and obesity, who presents to clinic today with a complaint of rectal itching and bleeding.      Patient was previously followed by Dr. Jarold Motto, but most recently was seen in clinic on 04/09/2014 by Amy Earlean Shawl, PA-C and care was transferred to Dr. Christella Hartigan. At that office visit, the patient complained of rectal pain and bleeding. She had relayed a history of having a hemorrhoidectomy at [facility] surgery several years ago. At that time she was experiencing constipation, discomfort and some bleeding. She was found to have a partially thrombosed external hemorrhoid with oozing of blood. It was recommended she continue Proctosol cream 3-4 times daily externally and apply 1 inch inside the rectum. Recta-care with 5% lidocaine cream was added 4-5 times daily as needed for pain, sitz baths were advised at least twice daily for 20 minutes at a time over the next 4-5 days. The patient was offered referral to [facility] however this hemorrhoid was just partially thrombosed and was thought to possibly resolve without any further intervention.    Per chart review patient recently called our clinic on 10/19/2016 was given Anusol HC cream. She then called our clinic 10/27 and explained that the recta-care and Anusol HC cream was not working.   Today, the patient describes that she typically keeps her hemorrhoids under control by avoiding straining and using Proctosol, wet wipes and Tucks pads for "flares". Most recently about 3 weeks ago she flew home to help her ill family member and became constipated and started with trouble with her hemorrhoid again, she used rect-care for the initial pain, as well as her regular combination of medicines, but this has continued to hurt her and worsen. She is also  passing some bright red blood with bowel movements and some clots to the point where she is having to wear a pad. She has been using Anusol HC cream over the past 4 days and has noticed that this has helped to shrink her hemorrhoid some, but she does continue with bleeding, itching and pain so much that it wakes her from her sleep. Patient is also doing sitz baths at home which do help for a period of time. Associated symptoms include a weight loss of 8 pounds as the patient was trying to avoid bowel movements and basically "I stopped eating".  Patient also complains of a rash under her stomach above her groin and into her rectal area that itches.  Patient denies fever, chills, melena, nausea, vomiting, reflux or abdominal pain.   Past Medical History:  Diagnosis Date  . ARTHRITIS   . ASTHMA, UNSPECIFIED   . Carpal tunnel syndrome   . CONSTIPATION   . FATTY LIVER DISEASE   . Fibromyalgia   . GERD   . HYPERTENSION   . Irritable bowel syndrome   . NEPHROLITHIASIS, HX OF   . OBESITY     Past Surgical History:  Procedure Laterality Date  . BLADDER SURGERY    . laparoscopy 1984    . VAGINAL HYSTERECTOMY      Current Outpatient Prescriptions  Medication Sig Dispense Refill  . albuterol (PROVENTIL HFA;VENTOLIN HFA) 108 (90 BASE) MCG/ACT inhaler Inhale 2 puffs into the lungs every 4 (four) hours as needed for wheezing or shortness of breath.    . carisoprodol (SOMA) 350 MG tablet Take 350 mg  by mouth at bedtime.    . Cholecalciferol (VITAMIN D) 2000 UNITS CAPS Take 1 capsule by mouth daily.    . hydrochlorothiazide (HYDRODIURIL) 25 MG tablet Take 12.5 mg by mouth daily.     . hydrocortisone (ANUSOL-HC) 2.5 % rectal cream Place 1 application rectally 2 (two) times daily as needed for hemorrhoids or itching. 30 g 1  . Ivermectin (SOOLANTRA) 1 % CREA Apply 1 application topically daily.    Marland Kitchen. omeprazole-sodium bicarbonate (ZEGERID) 40-1100 MG per capsule Take 1 capsule by mouth daily before  breakfast.    . propranolol ER (INDERAL LA) 60 MG 24 hr capsule Take 1 capsule (60 mg total) by mouth at bedtime. 30 capsule 5  . spironolactone (ALDACTONE) 50 MG tablet Take 50 mg by mouth daily.    Marland Kitchen. zolpidem (AMBIEN) 5 MG tablet Take 1 tablet (5 mg total) by mouth at bedtime as needed for sleep. 30 tablet 3   No current facility-administered medications for this visit.     Allergies as of 10/25/2016 - Review Complete 11/15/2014  Allergen Reaction Noted  . Meperidine hcl  01/07/2011  . Nystatin  01/07/2011  . Other  11/15/2014  Patient describes allergy to nystatin was a rash and she believes it was a cream given to her "years ago".  Family History  Problem Relation Age of Onset  . Lung cancer Father     PTSD  . Diabetes Mother   . Hypertension Mother   . Glaucoma Maternal Grandmother   . Multiple sclerosis Maternal Aunt     no family hx  . Cancer Other     1st cousin, Breast CA, deceased at 4035y    Social History   Social History  . Marital status: Married    Spouse name: N/A  . Number of children: N/A  . Years of education: N/A   Occupational History  . Not on file.   Social History Main Topics  . Smoking status: Never Smoker  . Smokeless tobacco: Not on file  . Alcohol use No  . Drug use: No  . Sexual activity: Not on file   Other Topics Concern  . 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      Review of Systems:     Constitutional: Positive for weight loss of 8 pounds over the past 3 weeks No fever, chills, weakness or fatigue HEENT: Eyes: No change in vision               Ears, Nose, Throat:  No change in hearing or congestion Skin: No rash or itching Cardiovascular: No chest pain, chest pressure or palpitations   Respiratory: No SOB or cough Gastrointestinal: See HPI and otherwise negative Genitourinary: No dysuria or change in urinary frequency Neurological: No  headache, dizziness or syncope Musculoskeletal: No new muscle or joint pain Hematologic: No bleeding or bruising Psychiatric: No history of depression or anxiety    Physical Exam:  Vital signs: Ht 5' 2.75" (1.594 m)   Wt 201 lb 3.2 oz (91.3 kg)   BMI 35.93 kg/m    General:   Pleasant African-American female appears to be in NAD, Well developed, Well nourished, alert and cooperative Head:  Normocephalic and atraumatic. Eyes:   PEERL, EOMI. No icterus. Conjunctiva pink. Ears:  Normal auditory acuity. Neck:  Supple Throat: Oral cavity and pharynx without inflammation, swelling or lesion.  Lungs: Respirations even and unlabored.  Lungs clear to auscultation bilaterally.   No wheezes, crackles, or rhonchi.  Heart: Normal S1, S2. No MRG. Regular rate and rhythm. No peripheral edema, cyanosis or pallor.  Abdomen:  Soft, nondistended, nontender. No rebound or guarding. Normal bowel sounds. No appreciable masses or hepatomegaly. Rectal: External exam: Reveals a thrombosed hemorrhoid which is very tender to the touch, unable to proceed with internal exam as there was some much pain, rash present in gluteal folds with satellite lesions Msk:  Symmetrical without gross deformities. Peripheral pulses intact.  Extremities:  Without edema, no deformity or joint abnormality. Normal ROM, normal sensation. Neurologic:  Alert and  oriented x4;  grossly normal neurologically. CN II-XII intact.  Skin:   Dry and intact without significant lesions or rashes. Rash present under panniculus, satellite lesions Psychiatric: Oriented to person, place and time. Demonstrates good judgement and reason without abnormal affect or behaviors.  No recent labs or imaging  Assessment: 1. Internal hemorrhoid thrombosed: Patient describes symptoms for 3 weeks with worsening rectal pain and intermittent rectal bleeding as well as itching which wakes her from her sleep, some better over the past 4 days with Anusol HC cream  applied to the area twice a day and sitz bath; recommend referral to [facility] for possible I&D 2. Intertriginous rash: Suspect this is likely yeast, we did discuss that the patient's side effects from Nystatin were not severe and she believes this was a cream, not a powder, she would like to try again for suspected yeast  Plan: 1. Referred patient to [facility] for possible I&D of thrombosed hemorrhoid due to patient's severe pain and minimal relief after 3 weeks of treatment. She has an appointment this afternoon. 2. Discussed with the patient that in the future we could set her up for a colonoscopy for further evaluation of her hemorrhoids and for colorectal cancer screening. She may be a candidate for hemorrhoid banding in the future. 3. Continue Anusol HC cream twice a day for the next 7-14 days 4. Continue sitz baths 2-3 times daily 5. Prescribed nystatin powder to be applied to intertriginous areas twice a day for 7-14 days. Did discuss with the patient that if this makes her rash worse or does not help, she should see her dermatologist for this. 6. Patient to follow in clinic as needed in the future.  Hyacinth MeekerJennifer Brayan Votaw, PA-C [facility] 10/25/2016, 2:05 PM  Cc: Clayborn Heronankins, Victoria R, MD

## 2016-10-25 NOTE — Telephone Encounter (Signed)
Patient has treated what she feels is an internal hemorrhoid with Recticare and Anusol-HC cream. She has pushed the Anusol_HC cream internally to the inside of her rectal opening. She doesn't feel it is improving and is having continued bleeding. Patient calls today with new problem. She has developed an intense itching and "little bumps" she can feel. The bleeding rectally has continued. She is on Amoxicillin 500 mg. She agrees to come in today for evaluation.

## 2016-10-25 NOTE — Patient Instructions (Signed)
You have been scheduled for an appointment with _____ at [facility]. Your appointment is - Please arrive at  for registration. Make certain to bring a list of current medications, including any over the counter medications or vitamins. Also bring your co-pay if you have one as well as your insurance cards. [facility] is located at [address], [address]. Should you need to reschedule your appointment, please contact them at [phone].    We have sent the following medications to your pharmacy for you to pick up at your convenience: Nystatin powder to rash twice a day for 7-14 days.   Continue sitz baths 2-3 times daily.   Continue Anusol Cream twice a day 7-14 days

## 2016-10-27 ENCOUNTER — Telehealth: Payer: Self-pay | Admitting: Physician Assistant

## 2016-10-27 NOTE — Telephone Encounter (Signed)
Has appt with dermatologist in December and they will put her on a wait list.  She will also try monistat. She will call back with any questions or concerns

## 2016-10-28 ENCOUNTER — Ambulatory Visit: Payer: 59 | Admitting: Physician Assistant

## 2017-01-21 DIAGNOSIS — B354 Tinea corporis: Secondary | ICD-10-CM | POA: Diagnosis not present

## 2017-01-21 DIAGNOSIS — L309 Dermatitis, unspecified: Secondary | ICD-10-CM | POA: Diagnosis not present

## 2017-02-08 DIAGNOSIS — I10 Essential (primary) hypertension: Secondary | ICD-10-CM | POA: Diagnosis not present

## 2017-02-08 DIAGNOSIS — E119 Type 2 diabetes mellitus without complications: Secondary | ICD-10-CM | POA: Diagnosis not present

## 2017-02-09 DIAGNOSIS — H10501 Unspecified blepharoconjunctivitis, right eye: Secondary | ICD-10-CM | POA: Diagnosis not present

## 2017-02-09 DIAGNOSIS — H15111 Episcleritis periodica fugax, right eye: Secondary | ICD-10-CM | POA: Diagnosis not present

## 2017-05-10 ENCOUNTER — Other Ambulatory Visit: Payer: Self-pay | Admitting: Obstetrics and Gynecology

## 2017-05-10 DIAGNOSIS — Z1231 Encounter for screening mammogram for malignant neoplasm of breast: Secondary | ICD-10-CM

## 2017-08-26 DIAGNOSIS — Z01419 Encounter for gynecological examination (general) (routine) without abnormal findings: Secondary | ICD-10-CM | POA: Diagnosis not present

## 2017-09-19 ENCOUNTER — Ambulatory Visit
Admission: RE | Admit: 2017-09-19 | Discharge: 2017-09-19 | Disposition: A | Discharge status: Home / Self Care | Payer: 59 | Source: Ambulatory Visit | Attending: Obstetrics and Gynecology | Admitting: Obstetrics and Gynecology

## 2017-09-19 DIAGNOSIS — Z1231 Encounter for screening mammogram for malignant neoplasm of breast: Secondary | ICD-10-CM | POA: Diagnosis not present

## 2017-10-01 DIAGNOSIS — Z23 Encounter for immunization: Secondary | ICD-10-CM | POA: Diagnosis not present

## 2017-10-03 DIAGNOSIS — L820 Inflamed seborrheic keratosis: Secondary | ICD-10-CM | POA: Diagnosis not present

## 2017-12-28 DIAGNOSIS — R002 Palpitations: Secondary | ICD-10-CM | POA: Diagnosis not present

## 2017-12-29 ENCOUNTER — Telehealth: Payer: Self-pay | Admitting: *Deleted

## 2017-12-29 NOTE — Telephone Encounter (Signed)
Fax referral from Beverley FiedlerVictoria Rankins, MD [facility] [facility] sent to Scheduling

## 2018-01-02 DIAGNOSIS — R002 Palpitations: Secondary | ICD-10-CM | POA: Diagnosis not present

## 2018-01-03 DIAGNOSIS — R002 Palpitations: Secondary | ICD-10-CM | POA: Diagnosis not present

## 2018-01-06 DIAGNOSIS — I10 Essential (primary) hypertension: Secondary | ICD-10-CM | POA: Diagnosis not present

## 2018-01-17 DIAGNOSIS — R7301 Impaired fasting glucose: Secondary | ICD-10-CM | POA: Diagnosis not present

## 2018-02-03 ENCOUNTER — Ambulatory Visit: Payer: 59 | Admitting: Cardiovascular Disease

## 2018-02-03 DIAGNOSIS — I10 Essential (primary) hypertension: Secondary | ICD-10-CM | POA: Diagnosis not present

## 2018-02-03 DIAGNOSIS — I493 Ventricular premature depolarization: Secondary | ICD-10-CM | POA: Diagnosis not present

## 2018-03-16 DIAGNOSIS — E785 Hyperlipidemia, unspecified: Secondary | ICD-10-CM | POA: Diagnosis not present

## 2018-03-16 DIAGNOSIS — Z0000 Encounter for general adult medical examination without abnormal findings: Secondary | ICD-10-CM | POA: Diagnosis not present

## 2018-03-16 DIAGNOSIS — I10 Essential (primary) hypertension: Secondary | ICD-10-CM | POA: Diagnosis not present

## 2018-04-03 DIAGNOSIS — N9089 Other specified noninflammatory disorders of vulva and perineum: Secondary | ICD-10-CM | POA: Diagnosis not present

## 2018-04-03 DIAGNOSIS — L723 Sebaceous cyst: Secondary | ICD-10-CM | POA: Diagnosis not present

## 2018-06-02 DIAGNOSIS — J029 Acute pharyngitis, unspecified: Secondary | ICD-10-CM | POA: Diagnosis not present

## 2018-06-02 DIAGNOSIS — H66009 Acute suppurative otitis media without spontaneous rupture of ear drum, unspecified ear: Secondary | ICD-10-CM | POA: Diagnosis not present

## 2018-06-06 DIAGNOSIS — H6693 Otitis media, unspecified, bilateral: Secondary | ICD-10-CM | POA: Diagnosis not present

## 2018-06-14 ENCOUNTER — Other Ambulatory Visit: Payer: Self-pay | Admitting: Obstetrics and Gynecology

## 2018-06-14 DIAGNOSIS — Z1231 Encounter for screening mammogram for malignant neoplasm of breast: Secondary | ICD-10-CM

## 2018-06-16 DIAGNOSIS — J029 Acute pharyngitis, unspecified: Secondary | ICD-10-CM | POA: Diagnosis not present

## 2018-06-16 DIAGNOSIS — H9202 Otalgia, left ear: Secondary | ICD-10-CM | POA: Diagnosis not present

## 2018-06-16 DIAGNOSIS — H6502 Acute serous otitis media, left ear: Secondary | ICD-10-CM | POA: Diagnosis not present

## 2018-07-05 DIAGNOSIS — M26621 Arthralgia of right temporomandibular joint: Secondary | ICD-10-CM | POA: Diagnosis not present

## 2018-07-05 DIAGNOSIS — H9201 Otalgia, right ear: Secondary | ICD-10-CM | POA: Diagnosis not present

## 2018-07-05 DIAGNOSIS — R070 Pain in throat: Secondary | ICD-10-CM | POA: Diagnosis not present

## 2018-07-17 DIAGNOSIS — M797 Fibromyalgia: Secondary | ICD-10-CM | POA: Diagnosis not present

## 2018-07-19 DIAGNOSIS — M5412 Radiculopathy, cervical region: Secondary | ICD-10-CM | POA: Diagnosis not present

## 2018-07-19 DIAGNOSIS — M7542 Impingement syndrome of left shoulder: Secondary | ICD-10-CM | POA: Diagnosis not present

## 2018-07-19 DIAGNOSIS — M542 Cervicalgia: Secondary | ICD-10-CM | POA: Diagnosis not present

## 2018-07-26 DIAGNOSIS — M79671 Pain in right foot: Secondary | ICD-10-CM | POA: Diagnosis not present

## 2018-07-26 DIAGNOSIS — R768 Other specified abnormal immunological findings in serum: Secondary | ICD-10-CM | POA: Diagnosis not present

## 2018-07-26 DIAGNOSIS — M791 Myalgia, unspecified site: Secondary | ICD-10-CM | POA: Diagnosis not present

## 2018-07-26 DIAGNOSIS — D8989 Other specified disorders involving the immune mechanism, not elsewhere classified: Secondary | ICD-10-CM | POA: Diagnosis not present

## 2018-07-26 DIAGNOSIS — M19041 Primary osteoarthritis, right hand: Secondary | ICD-10-CM | POA: Diagnosis not present

## 2018-07-26 DIAGNOSIS — M549 Dorsalgia, unspecified: Secondary | ICD-10-CM | POA: Diagnosis not present

## 2018-07-26 DIAGNOSIS — M79673 Pain in unspecified foot: Secondary | ICD-10-CM | POA: Diagnosis not present

## 2018-07-26 DIAGNOSIS — M2550 Pain in unspecified joint: Secondary | ICD-10-CM | POA: Diagnosis not present

## 2018-07-26 DIAGNOSIS — M79641 Pain in right hand: Secondary | ICD-10-CM | POA: Diagnosis not present

## 2018-08-18 DIAGNOSIS — M2550 Pain in unspecified joint: Secondary | ICD-10-CM | POA: Diagnosis not present

## 2018-08-18 DIAGNOSIS — M1990 Unspecified osteoarthritis, unspecified site: Secondary | ICD-10-CM | POA: Diagnosis not present

## 2018-08-18 DIAGNOSIS — R0989 Other specified symptoms and signs involving the circulatory and respiratory systems: Secondary | ICD-10-CM | POA: Diagnosis not present

## 2018-08-18 DIAGNOSIS — M797 Fibromyalgia: Secondary | ICD-10-CM | POA: Diagnosis not present

## 2018-08-18 DIAGNOSIS — M79643 Pain in unspecified hand: Secondary | ICD-10-CM | POA: Diagnosis not present

## 2018-09-06 DIAGNOSIS — R319 Hematuria, unspecified: Secondary | ICD-10-CM | POA: Diagnosis not present

## 2018-09-06 DIAGNOSIS — Z01419 Encounter for gynecological examination (general) (routine) without abnormal findings: Secondary | ICD-10-CM | POA: Diagnosis not present

## 2018-09-06 DIAGNOSIS — Z6836 Body mass index (BMI) 36.0-36.9, adult: Secondary | ICD-10-CM | POA: Diagnosis not present

## 2018-09-15 DIAGNOSIS — M79643 Pain in unspecified hand: Secondary | ICD-10-CM | POA: Diagnosis not present

## 2018-09-15 DIAGNOSIS — M797 Fibromyalgia: Secondary | ICD-10-CM | POA: Diagnosis not present

## 2018-09-15 DIAGNOSIS — M1990 Unspecified osteoarthritis, unspecified site: Secondary | ICD-10-CM | POA: Diagnosis not present

## 2018-09-20 ENCOUNTER — Ambulatory Visit
Admission: RE | Admit: 2018-09-20 | Discharge: 2018-09-20 | Disposition: A | Discharge status: Home / Self Care | Payer: 59 | Source: Ambulatory Visit | Attending: Obstetrics and Gynecology | Admitting: Obstetrics and Gynecology

## 2018-09-20 DIAGNOSIS — Z1231 Encounter for screening mammogram for malignant neoplasm of breast: Secondary | ICD-10-CM

## 2018-10-09 DIAGNOSIS — M542 Cervicalgia: Secondary | ICD-10-CM | POA: Diagnosis not present

## 2018-10-09 DIAGNOSIS — M7542 Impingement syndrome of left shoulder: Secondary | ICD-10-CM | POA: Diagnosis not present

## 2018-10-13 DIAGNOSIS — M797 Fibromyalgia: Secondary | ICD-10-CM | POA: Diagnosis not present

## 2018-10-13 DIAGNOSIS — M79643 Pain in unspecified hand: Secondary | ICD-10-CM | POA: Diagnosis not present

## 2018-10-13 DIAGNOSIS — M1990 Unspecified osteoarthritis, unspecified site: Secondary | ICD-10-CM | POA: Diagnosis not present

## 2018-10-16 DIAGNOSIS — Z23 Encounter for immunization: Secondary | ICD-10-CM | POA: Diagnosis not present

## 2018-12-01 DIAGNOSIS — M1990 Unspecified osteoarthritis, unspecified site: Secondary | ICD-10-CM | POA: Diagnosis not present

## 2018-12-01 DIAGNOSIS — M79643 Pain in unspecified hand: Secondary | ICD-10-CM | POA: Diagnosis not present

## 2018-12-01 DIAGNOSIS — M797 Fibromyalgia: Secondary | ICD-10-CM | POA: Diagnosis not present

## 2018-12-28 DIAGNOSIS — H9203 Otalgia, bilateral: Secondary | ICD-10-CM | POA: Diagnosis not present

## 2018-12-28 DIAGNOSIS — J069 Acute upper respiratory infection, unspecified: Secondary | ICD-10-CM | POA: Diagnosis not present

## 2018-12-28 DIAGNOSIS — R05 Cough: Secondary | ICD-10-CM | POA: Diagnosis not present

## 2019-01-03 DIAGNOSIS — E119 Type 2 diabetes mellitus without complications: Secondary | ICD-10-CM | POA: Diagnosis not present

## 2019-01-03 DIAGNOSIS — R05 Cough: Secondary | ICD-10-CM | POA: Diagnosis not present

## 2019-01-03 DIAGNOSIS — E7800 Pure hypercholesterolemia, unspecified: Secondary | ICD-10-CM | POA: Diagnosis not present

## 2019-01-03 DIAGNOSIS — J069 Acute upper respiratory infection, unspecified: Secondary | ICD-10-CM | POA: Diagnosis not present

## 2019-01-15 DIAGNOSIS — M79643 Pain in unspecified hand: Secondary | ICD-10-CM | POA: Diagnosis not present

## 2019-01-15 DIAGNOSIS — Z79899 Other long term (current) drug therapy: Secondary | ICD-10-CM | POA: Diagnosis not present

## 2019-01-15 DIAGNOSIS — M1990 Unspecified osteoarthritis, unspecified site: Secondary | ICD-10-CM | POA: Diagnosis not present

## 2019-01-15 DIAGNOSIS — M797 Fibromyalgia: Secondary | ICD-10-CM | POA: Diagnosis not present

## 2019-01-29 DIAGNOSIS — M0609 Rheumatoid arthritis without rheumatoid factor, multiple sites: Secondary | ICD-10-CM | POA: Diagnosis not present

## 2019-02-12 DIAGNOSIS — M0609 Rheumatoid arthritis without rheumatoid factor, multiple sites: Secondary | ICD-10-CM | POA: Diagnosis not present

## 2019-02-27 DIAGNOSIS — M0609 Rheumatoid arthritis without rheumatoid factor, multiple sites: Secondary | ICD-10-CM | POA: Diagnosis not present

## 2019-04-11 DIAGNOSIS — Z0000 Encounter for general adult medical examination without abnormal findings: Secondary | ICD-10-CM | POA: Diagnosis not present

## 2019-04-11 DIAGNOSIS — Z23 Encounter for immunization: Secondary | ICD-10-CM | POA: Diagnosis not present

## 2019-04-11 DIAGNOSIS — E119 Type 2 diabetes mellitus without complications: Secondary | ICD-10-CM | POA: Diagnosis not present

## 2019-04-11 DIAGNOSIS — K219 Gastro-esophageal reflux disease without esophagitis: Secondary | ICD-10-CM | POA: Diagnosis not present

## 2019-04-11 DIAGNOSIS — I10 Essential (primary) hypertension: Secondary | ICD-10-CM | POA: Diagnosis not present

## 2019-04-11 DIAGNOSIS — E785 Hyperlipidemia, unspecified: Secondary | ICD-10-CM | POA: Diagnosis not present

## 2019-04-20 DIAGNOSIS — M0609 Rheumatoid arthritis without rheumatoid factor, multiple sites: Secondary | ICD-10-CM | POA: Diagnosis not present

## 2019-04-20 DIAGNOSIS — M79643 Pain in unspecified hand: Secondary | ICD-10-CM | POA: Diagnosis not present

## 2019-04-20 DIAGNOSIS — Z79899 Other long term (current) drug therapy: Secondary | ICD-10-CM | POA: Diagnosis not present

## 2019-04-23 DIAGNOSIS — L4050 Arthropathic psoriasis, unspecified: Secondary | ICD-10-CM | POA: Diagnosis not present

## 2019-04-23 DIAGNOSIS — M0609 Rheumatoid arthritis without rheumatoid factor, multiple sites: Secondary | ICD-10-CM | POA: Diagnosis not present

## 2019-08-07 ENCOUNTER — Other Ambulatory Visit: Payer: Self-pay | Admitting: Obstetrics and Gynecology

## 2019-08-07 DIAGNOSIS — Z1231 Encounter for screening mammogram for malignant neoplasm of breast: Secondary | ICD-10-CM

## 2019-09-24 ENCOUNTER — Other Ambulatory Visit: Payer: Self-pay

## 2019-09-24 ENCOUNTER — Ambulatory Visit
Admission: RE | Admit: 2019-09-24 | Discharge: 2019-09-24 | Disposition: A | Discharge status: Home / Self Care | Payer: 59 | Source: Ambulatory Visit | Attending: Obstetrics and Gynecology | Admitting: Obstetrics and Gynecology

## 2019-09-24 DIAGNOSIS — Z1231 Encounter for screening mammogram for malignant neoplasm of breast: Secondary | ICD-10-CM

## 2020-05-13 ENCOUNTER — Telehealth: Payer: Self-pay | Admitting: Physician Assistant

## 2020-05-13 NOTE — Telephone Encounter (Signed)
The pt has been scheduled for an appt with Mike Gip on 6/8.  She was also advised to call her PCP for treatment until she can get into our office.  The pt has been advised of the information and verbalized understanding.

## 2020-05-13 NOTE — Telephone Encounter (Signed)
Patient has a lot of pain from hemorrhoids internal\external please advise

## 2020-06-03 ENCOUNTER — Ambulatory Visit: Payer: 59 | Admitting: Physician Assistant

## 2020-06-17 ENCOUNTER — Encounter: Payer: Self-pay | Admitting: Physician Assistant

## 2020-06-17 ENCOUNTER — Ambulatory Visit: Payer: 59 | Admitting: Physician Assistant

## 2020-06-17 VITALS — BP 127/70 | HR 83 | Ht 63.0 in | Wt 192.0 lb

## 2020-06-17 DIAGNOSIS — K649 Unspecified hemorrhoids: Secondary | ICD-10-CM | POA: Diagnosis not present

## 2020-06-17 DIAGNOSIS — Z1211 Encounter for screening for malignant neoplasm of colon: Secondary | ICD-10-CM | POA: Diagnosis not present

## 2020-06-17 MED ORDER — SUPREP BOWEL PREP KIT 17.5-3.13-1.6 GM/177ML PO SOLN: 1.0000 | ORAL | 0 refills | Status: DC

## 2020-06-17 NOTE — Progress Notes (Signed)
Chief Complaint: Hemorrhoids  HPI:    Andrea Rhodes is a 49 year old African-American female with past medical history as listed below, known to Dr. Christella Hartigan, who was referred to me by Rankins, Fanny Dance, MD for a complaint of hemorrhoids.      10/25/2016 patient seen in clinic by me and at that time complained of rectal itching and bleeding.  At that time described that she typically kept her hemorrhoids under control by avoiding straining and using Proctosol, wet wipes and Tucks pads.  At that time was in a flare.  He had used Anusol HC cream for 4 days which did help to shrink her hemorrhoids some but did not help with the bleeding or itching or pain.  At that time patient was noted to have a thrombosed hemorrhoid very tender to touch.  She was referred to [facility] for possible I&D.  We discussed setting her up for colonoscopy in the future to further discuss hemorrhoid banding.  She was also given nystatin powder for a yeast rash.    Today, the patient tells me that she has been diagnosed now with psoriatic arthritis, this seems to flare different times and occasionally in her buttocks.  Tells me she is on new medicine for this and it is giving her diarrhea.  Tells me that in general she has issues with her hemorrhoids about every 6 months when her Tucks pads, RectiCare and Anusol cream to stop working, this occurred again in May and she was seen by her PCP and could not get in with [place], so she was sent to [facility].  Apparently they did an I&D and removed 5 different clots.  This drastically helped with her pain and now she is just left with some skin tags.  She is frustrated with ongoing issues and wonders what to do.   Also tells me that eating salads now gives her diarrhea.    Denies fever, chills, weight loss or blood in her stool.  Past Medical History:  Diagnosis Date  . ARTHRITIS   . ASTHMA, UNSPECIFIED   . Carpal tunnel syndrome   . CONSTIPATION   . FATTY LIVER DISEASE   . Fibromyalgia   . GERD     . HYPERTENSION   . Irritable bowel syndrome   . NEPHROLITHIASIS, HX OF   . OBESITY     Past Surgical History:  Procedure Laterality Date  . BLADDER SURGERY    . laparoscopy 1984    . VAGINAL HYSTERECTOMY      Current Outpatient Medications  Medication Sig Dispense Refill  . albuterol (PROVENTIL HFA;VENTOLIN HFA) 108 (90 BASE) MCG/ACT inhaler Inhale 2 puffs into the lungs every 4 (four) hours as needed for wheezing or shortness of breath.    Marland Kitchen ampicillin (PRINCIPEN) 500 MG capsule Take 500 mg by mouth 4 (four) times daily.    . carisoprodol (SOMA) 350 MG tablet Take 350 mg by mouth at bedtime.    . Cholecalciferol (VITAMIN D) 2000 UNITS CAPS Take 1 capsule by mouth daily.    . hydrochlorothiazide (HYDRODIURIL) 25 MG tablet Take 12.5 mg by mouth daily.     . hydrocortisone (ANUSOL-HC) 2.5 % rectal cream Place 1 application rectally 2 (two) times daily as needed for hemorrhoids or itching. 30 g 1  . Ivermectin (SOOLANTRA) 1 % CREA Apply 1 application topically daily.    . mirabegron ER (MYRBETRIQ) 25 MG TB24 tablet Take 25 mg by mouth daily.    Marland Kitchen nystatin (MYCOSTATIN/NYSTOP) powder Apply topically 2 (  two) times daily. 7-14 days 15 g 0  . propranolol ER (INDERAL LA) 60 MG 24 hr capsule Take 1 capsule (60 mg total) by mouth at bedtime. (Patient not taking: Reported on 10/25/2016) 30 capsule 5  . spironolactone (ALDACTONE) 50 MG tablet Take 50 mg by mouth daily.    Marland Kitchen zolpidem (AMBIEN) 5 MG tablet Take 1 tablet (5 mg total) by mouth at bedtime as needed for sleep. (Patient not taking: Reported on 10/25/2016) 30 tablet 3   No current facility-administered medications for this visit.    Allergies as of 06/17/2020 - Review Complete 10/25/2016  Allergen Reaction Noted  . Meperidine hcl  01/07/2011  . Nystatin  01/07/2011  . Other  11/15/2014    Family History  Problem Relation Age of Onset  . Diabetes Mother   . Hypertension Mother   . Lung cancer Father        PTSD  . Glaucoma  Maternal Grandmother   . Multiple sclerosis Maternal Aunt        no family hx  . Cancer Other        1st cousin, Breast CA, deceased at 86y  . Breast cancer Cousin 73    Social History   Socioeconomic History  . Marital status: Married    Spouse name: Not on file  . Number of children: Not on file  . Years of education: Not on file  . Highest education level: Not on file  Occupational History  . Not on file  Tobacco Use  . Smoking status: Never Smoker  Substance and Sexual Activity  . Alcohol use: No    Alcohol/week: 0.0 standard drinks  . Drug use: No  . Sexual activity: Not on file  Other Topics Concern  . 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     Social Determinants of Health   Financial Resource Strain:   . Difficulty of Paying Living Expenses:   Food Insecurity:   . Worried About Charity fundraiser in the Last Year:   . Arboriculturist in the Last Year:   Transportation Needs:   . Film/video editor (Medical):   Marland Kitchen Lack of Transportation (Non-Medical):   Physical Activity:   . Days of Exercise per Week:   . Minutes of Exercise per Session:   Stress:   . Feeling of Stress :   Social Connections:   . Frequency of Communication with Friends and Family:   . Frequency of Social Gatherings with Friends and Family:   . Attends Religious Services:   . Active Member of Clubs or Organizations:   . Attends Archivist Meetings:   Marland Kitchen Marital Status:   Intimate Partner Violence:   . Fear of Current or Ex-Partner:   . Emotionally Abused:   Marland Kitchen Physically Abused:   . Sexually Abused:     Review of Systems:    Constitutional: No weight loss, fever or chills Cardiovascular: No chest pain Respiratory: No SOB Gastrointestinal: See HPI and otherwise negative   Physical Exam:  Vital signs: BP 127/70   Pulse 83   Ht 5\' 3"  (1.6 m)   Wt 192 lb (87.1 kg)   SpO2 98%    BMI 34.01 kg/m   Constitutional:   Pleasant AA female appears to be in NAD, Well developed, Well nourished, alert and cooperative Respiratory: Respirations even and unlabored. Lungs clear to auscultation bilaterally.  No wheezes, crackles, or rhonchi.  Cardiovascular: Normal S1, S2. No MRG. Regular rate and rhythm. No peripheral edema, cyanosis or pallor.  Gastrointestinal:  Soft, nondistended, nontender. No rebound or guarding. Normal bowel sounds. No appreciable masses or hepatomegaly. Rectal:  Not performed.  Psychiatric: Demonstrates good judgement and reason without abnormal affect or behaviors.  No recent labs.  Assessment: 1.  Screening for colorectal cancer: Patient has never had a colonoscopy, recommend she have a now for further evaluation of hemorrhoids and possible banding in the future 2.  Hemorrhoids: Trouble with external and internal hemorrhoids, has been seen by [facility] recently with I&D  Plan: 1.  Scheduled patient for colonoscopy in the [facility] with Dr. Christella Hartigan.  Did discuss risks, benefits, limitations and alternatives and the patient agrees to proceed.  Explained that at time of colonoscopy Dr. Christella Hartigan can better evaluate whether she would be a good candidate for banding. 2.  For now continue current remedies for hemorrhoids including Tucks pads, RectiCare and Anusol cream. 3.  Discussed fiber, the patient is doing a supplement, also discussed Imodium as needed for diarrhea. 4.  Patient to follow in clinic per recommendations from Dr. Christella Hartigan after time of procedure.  Hyacinth Meeker, PA-C [facility] 06/17/2020, 2:09 PM  Cc: Clayborn Heron, MD

## 2020-06-17 NOTE — Patient Instructions (Signed)
If you are age 50 or older, your body mass index should be between 23-30. Your Body mass index is 34.01 kg/m. If this is out of the aforementioned range listed, please consider follow up with your Primary Care Provider.  If you are age 45 or younger, your body mass index should be between 19-25. Your Body mass index is 34.01 kg/m. If this is out of the aformentioned range listed, please consider follow up with your Primary Care Provider.    You have been scheduled for a colonoscopy. Please follow written instructions given to you at your visit today.  Please pick up your prep supplies at the pharmacy within the next 1-3 days. If you use inhalers (even only as needed), please bring them with you on the day of your procedure.   Due to recent changes in healthcare laws, you may see the results of your imaging and laboratory studies on MyChart before your provider has had a chance to review them.  We understand that in some cases there may be results that are confusing or concerning to you. Not all laboratory results come back in the same time frame and the provider may be waiting for multiple results in order to interpret others.  Please give [place] 48 hours in order for your provider to thoroughly review all the results before contacting the office for clarification of your results.   We have sent the following medications to your pharmacy for you to pick up at your convenience: Suprep   Thank you for choosing me and [facility].  Osborn Coho

## 2020-06-18 NOTE — Progress Notes (Signed)
I agree with the above note, plan 

## 2020-06-19 ENCOUNTER — Telehealth: Payer: Self-pay | Admitting: Physician Assistant

## 2020-06-20 ENCOUNTER — Encounter: Payer: Self-pay | Admitting: Gastroenterology

## 2020-06-24 NOTE — Telephone Encounter (Signed)
Patient called still waiting for returned phone call

## 2020-06-24 NOTE — Telephone Encounter (Signed)
Returned pt's call. Informed her that Suprep is the preferred prep for her ins. We can change prep,but she would not have any coverage with a prep that's not preferred by her ins. I explained to her that 50.00 dollars is good compared to 150.00, which is sometimes the case with ins. Pt was thankful for the information and will stick with Suprep.

## 2020-07-01 ENCOUNTER — Encounter: Payer: Self-pay | Admitting: Gastroenterology

## 2020-07-01 ENCOUNTER — Ambulatory Visit (AMBULATORY_SURGERY_CENTER): Payer: 59 | Admitting: Gastroenterology

## 2020-07-01 ENCOUNTER — Other Ambulatory Visit: Payer: Self-pay

## 2020-07-01 VITALS — BP 136/88 | HR 62 | Temp 97.1°F | Resp 15 | Ht 63.0 in | Wt 190.0 lb

## 2020-07-01 DIAGNOSIS — R197 Diarrhea, unspecified: Secondary | ICD-10-CM | POA: Diagnosis present

## 2020-07-01 DIAGNOSIS — Z1211 Encounter for screening for malignant neoplasm of colon: Secondary | ICD-10-CM

## 2020-07-01 MED ORDER — SODIUM CHLORIDE 0.9 % IV SOLN: 500.0000 mL | Freq: Once | INTRAVENOUS | Status: DC

## 2020-07-01 NOTE — Progress Notes (Signed)
Pt's states no medical or surgical changes since previsit or office visit.  Vs-cw  Check in -mz 

## 2020-07-01 NOTE — Patient Instructions (Addendum)
HANDOUTS PROVIDED ON: HEMORRHOIDS  The biopsies taken today have been sent for pathology.  The results can take 1-3 weeks to receive.  When your next colonoscopy should occur will be based on the pathology results.    You may resume your previous diet and medication schedule.  Please start taking Imodium shortly after you wake up each morning.  Thank you for allowing [place] to care for you today!!!   YOU HAD AN ENDOSCOPIC PROCEDURE TODAY AT THE [facility]:   Refer to the procedure report that was given to you for any specific questions about what was found during the examination.  If the procedure report does not answer your questions, please call your gastroenterologist to clarify.  If you requested that your care partner not be given the details of your procedure findings, then the procedure report has been included in a sealed envelope for you to review at your convenience later.  YOU SHOULD EXPECT: Some feelings of bloating in the abdomen. Passage of more gas than usual.  Walking can help get rid of the air that was put into your GI tract during the procedure and reduce the bloating. If you had a lower endoscopy (such as a colonoscopy or flexible sigmoidoscopy) you may notice spotting of blood in your stool or on the toilet paper. If you underwent a bowel prep for your procedure, you may not have a normal bowel movement for a few days.  Please Note:  You might notice some irritation and congestion in your nose or some drainage.  This is from the oxygen used during your procedure.  There is no need for concern and it should clear up in a day or so.  SYMPTOMS TO REPORT IMMEDIATELY:   Following lower endoscopy (colonoscopy or flexible sigmoidoscopy):  Excessive amounts of blood in the stool  Significant tenderness or worsening of abdominal pains  Swelling of the abdomen that is new, acute  Fever of 100F or higher  For urgent or emergent issues, a gastroenterologist can be  reached at any hour by calling [phone]. Do not use MyChart messaging for urgent concerns.    DIET:  We do recommend a small meal at first, but then you may proceed to your regular diet.  Drink plenty of fluids but you should avoid alcoholic beverages for 24 hours.  ACTIVITY:  You should plan to take it easy for the rest of today and you should NOT DRIVE or use heavy machinery until tomorrow (because of the sedation medicines used during the test).    FOLLOW UP: Our staff will call the number listed on your records 48-72 hours following your procedure to check on you and address any questions or concerns that you may have regarding the information given to you following your procedure. If we do not reach you, we will leave a message.  We will attempt to reach you two times.  During this call, we will ask if you have developed any symptoms of COVID 19. If you develop any symptoms (ie: fever, flu-like symptoms, shortness of breath, cough etc.) before then, please call [phone].  If you test positive for Covid 19 in the 2 weeks post procedure, please call and report this information to [place].    If any biopsies were taken you will be contacted by phone or by letter within the next 1-3 weeks.  Please call us at [phone] if you have not heard about the biopsies in 3 weeks.    SIGNATURES/CONFIDENTIALITY: You  and/or your care partner have signed paperwork which will be entered into your electronic medical record.  These signatures attest to the fact that that the information above on your After Visit Summary has been reviewed and is understood.  Full responsibility of the confidentiality of this discharge information lies with you and/or your care-partner.

## 2020-07-01 NOTE — Op Note (Signed)
[facility] Patient Name: Andrea Rhodes Procedure Date: 07/01/2020 10:57 AM MRN: [id number] Endoscopist: Rachael Fee , MD Age: 49 Referring MD:  Date of Birth: 01-11-1971 Gender: Female Account #: [id number] Procedure:                Colonoscopy Indications:              Screening for colorectal malignant neoplasm Medicines:                Monitored Anesthesia Care Procedure:                Pre-Anesthesia Assessment:                           - Prior to the procedure, a History and Physical                            was performed, and patient medications and                            allergies were reviewed. The patient's tolerance of                            previous anesthesia was also reviewed. The risks                            and benefits of the procedure and the sedation                            options and risks were discussed with the patient.                            All questions were answered, and informed consent                            was obtained. Prior Anticoagulants: The patient has                            taken no previous anticoagulant or antiplatelet                            agents. ASA Grade Assessment: III - A patient with                            severe systemic disease. After reviewing the risks                            and benefits, the patient was deemed in                            satisfactory condition to undergo the procedure.                           After obtaining informed consent, the colonoscope  was passed under direct vision. Throughout the                            procedure, the patient's blood pressure, pulse, and                            oxygen saturations were monitored continuously. The                            Colonoscope was introduced through the anus and                            advanced to the the terminal ileum. The colonoscopy                            was performed  without difficulty. The patient                            tolerated the procedure well. The quality of the                            bowel preparation was good. Scope In: 11:18:01 AM Scope Out: 11:28:33 AM Scope Withdrawal Time: 0 hours 7 minutes 46 seconds  Total Procedure Duration: 0 hours 10 minutes 32 seconds  Findings:                 The terminal ileum appeared normal.                           The colon (entire examined portion) appeared                            normal. Biopsies for histology were taken with a                            cold forceps from the entire colon for evaluation                            of microscopic colitis.                           There was a small amount of deflated external                            hemorrhoid tissue. No internal hemorrhoids. Complications:            No immediate complications. Estimated blood loss:                            None. Estimated Blood Loss:     Estimated blood loss: none. Impression:               - The examined portion of the ileum was normal.                           - The  entire examined colon is normal. Biopsied to                            check for microscopic colitis.                           - There was a small amount of deflated external                            hemorrhoid tissue. No internal hemorrhoids. Recommendation:           - Patient has a contact number available for                            emergencies. The signs and symptoms of potential                            delayed complications were discussed with the                            patient. Return to normal activities tomorrow.                            Written discharge instructions were provided to the                            patient.                           - Resume previous diet.                           - Continue present medications. Please try a single                            OTC imodium shortly after waking every  morning for                            now.                           - Await pathology results. Rachael Fee, MD 07/01/2020 11:34:34 AM This report has been signed electronically.

## 2020-07-01 NOTE — Progress Notes (Signed)
A and O x3. Report to RN. Tolerated MAC anesthesia well.

## 2020-07-01 NOTE — Progress Notes (Signed)
Called to room to assist during endoscopic procedure.  Patient ID and intended procedure confirmed with present staff. Received instructions for my participation in the procedure from the performing physician.  

## 2020-07-03 ENCOUNTER — Telehealth: Payer: Self-pay | Admitting: *Deleted

## 2020-07-03 NOTE — Telephone Encounter (Signed)
  Follow up Call-  Call back number 07/01/2020  Post procedure Call Back phone  # [phone]  Permission to leave phone message Yes  Some recent data might be hidden     Patient questions:  Do you have a fever, pain , or abdominal swelling? No. Pain Score  0 *  Have you tolerated food without any problems? Yes.    Have you been able to return to your normal activities? Yes.    Do you have any questions about your discharge instructions: Diet   No. Medications  No. Follow up visit  No.  Do you have questions or concerns about your Care? No.  Actions: * If pain score is 4 or above: No action needed, pain <4.  1. Have you developed a fever since your procedure? no  2.   Have you had an respiratory symptoms (SOB or cough) since your procedure? no  3.   Have you tested positive for COVID 19 since your procedure no  4.   Have you had any family members/close contacts diagnosed with the COVID 19 since your procedure?  no   If yes to any of these questions please route to Laverna Peace, RN and Charlett Lango, RN

## 2020-07-04 ENCOUNTER — Encounter: Payer: Self-pay | Admitting: Gastroenterology

## 2020-08-12 ENCOUNTER — Other Ambulatory Visit: Payer: Self-pay | Admitting: Obstetrics and Gynecology

## 2020-08-12 DIAGNOSIS — Z1231 Encounter for screening mammogram for malignant neoplasm of breast: Secondary | ICD-10-CM

## 2020-09-25 ENCOUNTER — Other Ambulatory Visit: Payer: Self-pay

## 2020-09-25 ENCOUNTER — Ambulatory Visit
Admission: RE | Admit: 2020-09-25 | Discharge: 2020-09-25 | Disposition: A | Discharge status: Home / Self Care | Payer: 59 | Source: Ambulatory Visit | Attending: Obstetrics and Gynecology | Admitting: Obstetrics and Gynecology

## 2020-09-25 DIAGNOSIS — Z1231 Encounter for screening mammogram for malignant neoplasm of breast: Secondary | ICD-10-CM

## 2020-09-29 ENCOUNTER — Other Ambulatory Visit: Payer: Self-pay | Admitting: Obstetrics and Gynecology

## 2020-09-29 DIAGNOSIS — R928 Other abnormal and inconclusive findings on diagnostic imaging of breast: Secondary | ICD-10-CM

## 2020-10-02 ENCOUNTER — Other Ambulatory Visit: Payer: Self-pay

## 2020-10-02 ENCOUNTER — Other Ambulatory Visit: Payer: Self-pay | Admitting: Obstetrics and Gynecology

## 2020-10-02 ENCOUNTER — Ambulatory Visit
Admission: RE | Admit: 2020-10-02 | Discharge: 2020-10-02 | Disposition: A | Discharge status: Home / Self Care | Payer: 59 | Source: Ambulatory Visit | Attending: Obstetrics and Gynecology | Admitting: Obstetrics and Gynecology

## 2020-10-02 DIAGNOSIS — R928 Other abnormal and inconclusive findings on diagnostic imaging of breast: Secondary | ICD-10-CM

## 2020-10-13 ENCOUNTER — Ambulatory Visit
Admission: RE | Admit: 2020-10-13 | Discharge: 2020-10-13 | Disposition: A | Discharge status: Home / Self Care | Payer: 59 | Source: Ambulatory Visit | Attending: Obstetrics and Gynecology | Admitting: Obstetrics and Gynecology

## 2020-10-13 ENCOUNTER — Other Ambulatory Visit: Payer: Self-pay

## 2020-10-13 ENCOUNTER — Other Ambulatory Visit: Payer: Self-pay | Admitting: Obstetrics and Gynecology

## 2020-10-13 DIAGNOSIS — R928 Other abnormal and inconclusive findings on diagnostic imaging of breast: Secondary | ICD-10-CM

## 2021-05-28 ENCOUNTER — Encounter: Payer: Self-pay | Admitting: Cardiology

## 2021-05-28 ENCOUNTER — Other Ambulatory Visit: Payer: Self-pay

## 2021-05-28 ENCOUNTER — Ambulatory Visit: Payer: 59 | Admitting: Cardiology

## 2021-05-28 ENCOUNTER — Ambulatory Visit (INDEPENDENT_AMBULATORY_CARE_PROVIDER_SITE_OTHER): Payer: 59

## 2021-05-28 VITALS — BP 132/92 | HR 76 | Ht 63.0 in | Wt 203.0 lb

## 2021-05-28 DIAGNOSIS — R079 Chest pain, unspecified: Secondary | ICD-10-CM | POA: Diagnosis not present

## 2021-05-28 DIAGNOSIS — E785 Hyperlipidemia, unspecified: Secondary | ICD-10-CM

## 2021-05-28 DIAGNOSIS — R002 Palpitations: Secondary | ICD-10-CM | POA: Diagnosis not present

## 2021-05-28 DIAGNOSIS — I10 Essential (primary) hypertension: Secondary | ICD-10-CM | POA: Diagnosis not present

## 2021-05-28 MED ORDER — METOPROLOL TARTRATE 100 MG PO TABS: ORAL_TABLET | ORAL | 0 refills | Status: DC

## 2021-05-28 NOTE — Patient Instructions (Signed)
Medication Instructions:  Your physician recommends that you continue on your current medications as directed. Please refer to the Current Medication list given to you today.  *If you need a refill on your cardiac medications before your next appointment, please call your pharmacy*   Testing/Procedures: Your physician has requested that you have an echocardiogram. Echocardiography is a painless test that uses sound waves to create images of your heart. It provides your doctor with information about the size and shape of your heart and how well your heart's chambers and valves are working. This procedure takes approximately one hour. There are no restrictions for this procedure.  This will be done at our [facility] location:  [address]  ZIO XT- Long Term Monitor Instructions   Your physician has requested you wear a ZIO patch monitor for _3__ days.  This is a single patch monitor.   [employer] supplies one patch monitor per enrollment. Additional stickers are not available. Please do not apply patch if you will be having a Nuclear Stress Test, Echocardiogram, Cardiac CT, MRI, or Chest Xray during the period you would be wearing the monitor. The patch cannot be worn during these tests. You cannot remove and re-apply the ZIO XT patch monitor.  Your ZIO patch monitor will be sent Fed Ex from [employer] directly to your home address. It may take 3-5 days to receive your monitor after you have been enrolled.  Once you have received your monitor, please review the enclosed instructions. Your monitor has already been registered assigning a specific monitor serial # to you.  Billing and Patient Assistance Program Information   We have supplied [employer] with any of your insurance information on file for billing purposes. [employer] offers a sliding scale Patient Assistance Program for patients that do not have insurance, or whose insurance does not completely cover the cost of  the ZIO monitor.   You must apply for the Patient Assistance Program to qualify for this discounted rate.     To apply, please call [employer] at [phone], select option 4, then select option 2, and ask to apply for Patient Assistance Program.  Meredeth Ide will ask your household income, and how many people are in your household.  They will quote your out-of-pocket cost based on that information.  [employer] will also be able to set up a 59-month, interest-free payment plan if needed.  Applying the monitor   Shave hair from upper left chest.  Hold abrader disc by orange tab. Rub abrader in 40 strokes over the upper left chest as indicated in your monitor instructions.  Clean area with 4 enclosed alcohol pads. Let dry.  Apply patch as indicated in monitor instructions. Patch will be placed under collarbone on left side of chest with arrow pointing upward.  Rub patch adhesive wings for 2 minutes. Remove white label marked "1". Remove the white label marked "2". Rub patch adhesive wings for 2 additional minutes.  While looking in a mirror, press and release button in center of patch. A small green light will flash 3-4 times. This will be your only indicator that the monitor has been turned on. ?  Do not shower for the first 24 hours. You may shower after the first 24 hours.  Press the button if you feel a symptom. You will hear a small click. Record Date, Time and Symptom in the Patient Logbook.  When you are ready to remove the patch, follow instructions on the last 2 pages of  the Patient Logbook. Stick patch monitor onto the last page of Patient Logbook.  Place Patient Logbook in the blue and white box.  Use locking tab on box and tape box closed securely.  The blue and white box has prepaid postage on it. Please place it in the mailbox as soon as possible. Your physician should have your test results approximately 7 days after the monitor has been mailed back to [facility].  Call [facility] Customer  Care at [phone] if you have questions regarding your ZIO XT patch monitor. Call them immediately if you see an orange light blinking on your monitor.  If your monitor falls off in less than 4 days, contact our Monitor department at [phone]. ?If your monitor becomes loose or falls off after 4 days call [employer] at [phone] for suggestions on securing your monitor.?  Coronary CTA-see instructions below  Follow-Up: At [facility], you and your health needs are our priority.  As part of our continuing mission to provide you with exceptional heart care, we have created designated Provider Care Teams.  These Care Teams include your primary Cardiologist (physician) and Advanced Practice Providers (APPs -  Physician Assistants and Nurse Practitioners) who all work together to provide you with the care you need, when you need it.  We recommend signing up for the patient portal called "MyChart".  Sign up information is provided on this After Visit Summary.  MyChart is used to connect with patients for Virtual Visits (Telemedicine).  Patients are able to view lab/test results, encounter notes, upcoming appointments, etc.  Non-urgent messages can be sent to your provider as well.   To learn more about what you can do with MyChart, go to [email or web address].    Your next appointment:   3 month(s)  The format for your next appointment:   In Person  Provider:   Epifanio Lesches, MD   Other Instructions Your cardiac CT will be scheduled at one of the below locations:   [facility] [address] [place], [place] [address] [phone]  If scheduled at [facility], please arrive at the [facility] main entrance (entrance A) of [facility] 30 minutes prior to test start time. Proceed to the [facility] Radiology Department (first floor) to check-in and test prep.  Please follow these instructions carefully (unless otherwise directed):  On the  Night Before the Test: . Be sure to Drink plenty of water. . Do not consume any caffeinated/decaffeinated beverages or chocolate 12 hours prior to your test. . Do not take any antihistamines 12 hours prior to your test.  On the Day of the Test: . Drink plenty of water until 1 hour prior to the test. . Do not eat any food 4 hours prior to the test. . You may take your regular medications prior to the test.  . Take metoprolol (Lopressor) two hours prior to test. . HOLD Furosemide/Hydrochlorothiazide morning of the test. . FEMALES- please wear underwire-free bra if available       After the Test: . Drink plenty of water. . After receiving IV contrast, you may experience a mild flushed feeling. This is normal. . On occasion, you may experience a mild rash up to 24 hours after the test. This is not dangerous. If this occurs, you can take Benadryl 25 mg and increase your fluid intake. . If you experience trouble breathing, this can be serious. If it is severe call 911 IMMEDIATELY. If it is mild, please call our office. Marland Kitchen  If you take any of these medications: Glipizide/Metformin, Avandament, Glucavance, please do not take 48 hours after completing test unless otherwise instructed.   Once we have confirmed authorization from your insurance company, we will call you to set up a date and time for your test. Based on how quickly your insurance processes prior authorizations requests, please allow up to 4 weeks to be contacted for scheduling your Cardiac CT appointment. Be advised that routine Cardiac CT appointments could be scheduled as many as 8 weeks after your provider has ordered it.  For non-scheduling related questions, please contact the cardiac imaging nurse navigator should you have any questions/concerns: Rockwell Alexandria, Cardiac Imaging Nurse Navigator Larey Brick, Cardiac Imaging Nurse Navigator [facility] Direct Office Dial: [phone]   For scheduling  needs, including cancellations and rescheduling, please call [place], [phone].

## 2021-05-28 NOTE — Progress Notes (Unsigned)
Patient enrolled for [employer] to mail a 3 day ZIO XT to her home. 

## 2021-05-28 NOTE — Progress Notes (Signed)
Cardiology Office Note:    Date:  05/28/2021   ID:  Andrea Rhodes, DOB Jan 14, 1971, MRN [id number]  PCP:  Clayborn Heronankins, Victoria R, MD  Cardiologist:  None  Electrophysiologist:  None   Referring MD: Dennie MaizesEasley, Jessica, NP   Chief Complaint  Patient presents with  . Chest Pain    History of Present Illness:    Andrea Rhodes is a 50 y.o. female with a hx of hypertension, asthma, GERD PVCs fibromyalgia, psoriatic arthritis who is referred by Patsi SearsJessica Easley,NP for evaluation of palpitations and chest pain.    Today, she is doing well. She has been experiencing palpitations. She states that she works from home and when she is working she experiences palpitations her fit bit notifies her that her heart rate will jump to the 120 range. She states that her resting heart rate is 70s. The palpitations last no more than ten to fifteen minutes. She reports that when the palpitations occur she experiences SOB. She is also experiencing chest pain located on the left side of her chest and under her left breast. She describes it as a "piecing" and "aching" pain. The pain is waxing and waning daily. She reports that yawning or stretching exacerbates the pain. She rates the pain a 10 out of a 1-10 scale. She also states that she is experiencing bilateral lower extremity edema but believes the cause is her diagnosis of psoriatic arthritis. She also states that she is maintaining her blood pressure at home and her average reading is 11/82. She denies any lightheadedness or syncope.    Past Medical History:  Diagnosis Date  . ARTHRITIS   . ASTHMA, UNSPECIFIED   . Carpal tunnel syndrome   . CONSTIPATION   . FATTY LIVER DISEASE   . Fibromyalgia   . GERD   . HYPERTENSION   . Irritable bowel syndrome   . NEPHROLITHIASIS, HX OF   . OBESITY     Past Surgical History:  Procedure Laterality Date  . BLADDER SURGERY    . laparoscopy 1984    . VAGINAL HYSTERECTOMY      Current Medications: Current  Meds  Medication Sig  . albuterol (PROVENTIL HFA;VENTOLIN HFA) 108 (90 BASE) MCG/ACT inhaler Inhale 2 puffs into the lungs every 4 (four) hours as needed for wheezing or shortness of breath.  . Cholecalciferol (VITAMIN D) 2000 UNITS CAPS Take 1 capsule by mouth daily.  . COSENTYX SENSOREADY, 300 MG, 150 MG/ML SOAJ Inject into the skin. Every 4 weeks  . docusate sodium (COLACE) 100 MG capsule Take 100 mg by mouth as needed.  . folic acid (FOLVITE) 1 MG tablet Take 1 mg by mouth daily. Patient takes 2 tablets daily  . lisinopril-hydrochlorothiazide (ZESTORETIC) 20-25 MG tablet Take 1 tablet by mouth daily.  . metFORMIN (GLUCOPHAGE) 500 MG tablet Take 500 mg by mouth daily.  . methotrexate 2.5 MG tablet Take 2.5 mg by mouth See admin instructions. Patient total 10 mg once weekly  . metoprolol tartrate (LOPRESSOR) 100 MG tablet Take 100 mg (1 tablet) 2 hours prior to CT scan  . Omeprazole-Sodium Bicarbonate (ZEGERID) 20-1100 MG CAPS capsule Take 1 capsule by mouth daily before breakfast.  . pravastatin (PRAVACHOL) 40 MG tablet Take 40 mg by mouth daily.  . predniSONE (DELTASONE) 5 MG tablet Take 5 mg by mouth daily.  Marland Kitchen. tiZANidine (ZANAFLEX) 2 MG tablet Take 2 mg by mouth daily as needed.     Allergies:   Meperidine hcl, Nystatin, and Other   Social History  Socioeconomic History  . Marital status: Married    Spouse name: Not on file  . Number of children: Not on file  . Years of education: Not on file  . Highest education level: Not on file  Occupational History  . Not on file  Tobacco Use  . Smoking status: Never Smoker  . Smokeless tobacco: Never Used  Substance and Sexual Activity  . Alcohol use: No    Alcohol/week: 0.0 standard drinks  . Drug use: No  . Sexual activity: Not on file  Other Topics Concern  . 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     Social Determinants of Health    Financial Resource Strain: Not on file  Food Insecurity: Not on file  Transportation Needs: Not on file  Physical Activity: Not on file  Stress: Not on file  Social Connections: Not on file     Family History: The patient's family history includes Breast cancer (age of onset: 35) in her cousin; Cancer in an other family member; Diabetes in her mother; Glaucoma in her maternal grandmother; Hypertension in her mother; Lung cancer in her father; Multiple sclerosis in her maternal aunt.  ROS:   Please see the history of present illness.     All other systems reviewed and are negative.  EKG's/Labs/Other Studies Reviewed:    The following studies were reviewed today:   EKG:   05/28/2021- The EKG ordered today demonstrates sinus rhythm, rate 76, no S/T abnormalities    Recent Labs: No results found for requested labs within last 8760 hours.  Recent Lipid Panel No results found for: CHOL, TRIG, HDL, CHOLHDL, VLDL, LDLCALC, LDLDIRECT  Physical Exam:    VS:  BP (!) 132/92 (BP Location: Left Arm, Patient Position: Sitting)   Pulse 76   Ht 5\' 3"  (1.6 m)   Wt 203 lb (92.1 kg)   SpO2 98%   BMI 35.96 kg/m     Wt Readings from Last 3 Encounters:  05/28/21 203 lb (92.1 kg)  07/01/20 190 lb (86.2 kg)  06/17/20 192 lb (87.1 kg)     GEN:  Well nourished, well developed in no acute distress HEENT: Normal NECK: No JVD; No carotid bruits LYMPHATICS: No lymphadenopathy CARDIAC: RRR, no murmurs, rubs, gallops RESPIRATORY:  Clear to auscultation without rales, wheezing or rhonchi  ABDOMEN: Soft, non-tender, non-distended MUSCULOSKELETAL:  No edema; No deformity  SKIN: Warm and dry NEUROLOGIC:  Alert and oriented x 3 PSYCHIATRIC:  Normal affect   ASSESSMENT:    1. Palpitations   2. Chest pain of uncertain etiology   3. Essential hypertension   4. Hyperlipidemia, unspecified hyperlipidemia type    PLAN:    Palpitations: Description concerning for arrhythmia,will  evaluate with  Zio patch x3 days.  Check echocardiogram to rule out structural heart disease.  Chest pain: Atypical in description, but does have CAD risk factors (hypertension, hyperlipidemia, T2DM).  Will evaluate for obstructive CAD with coronary CTA.  Will give Lopressor 100 mg prior to study.  Hypertension:on lisinopril-HCTZ 20-25 mg daily.   Hyperlipidemia: on pravastatin 40 mg daily.  LDL 82 on 10/02/20.  We will follow-up results of coronary CTA to guide how aggressive to be in lowering cholesterol.  T2DM: on metformin.  Last A1c 6.1%  RTC in 3 months    Medication Adjustments/Labs and Tests Ordered: Current medicines are reviewed at length with the patient today.  Concerns regarding  medicines are outlined above.  Orders Placed This Encounter  Procedures  . CT CORONARY MORPH W/CTA COR W/SCORE W/CA W/CM &/OR WO/CM  . LONG TERM MONITOR (3-14 DAYS)  . EKG 12-Lead  . ECHOCARDIOGRAM COMPLETE   Meds ordered this encounter  Medications  . metoprolol tartrate (LOPRESSOR) 100 MG tablet    Sig: Take 100 mg (1 tablet) 2 hours prior to CT scan    Dispense:  1 tablet    Refill:  0    Patient Instructions  Medication Instructions:  Your physician recommends that you continue on your current medications as directed. Please refer to the Current Medication list given to you today.  *If you need a refill on your cardiac medications before your next appointment, please call your pharmacy*   Testing/Procedures: Your physician has requested that you have an echocardiogram. Echocardiography is a painless test that uses sound waves to create images of your heart. It provides your doctor with information about the size and shape of your heart and how well your heart's chambers and valves are working. This procedure takes approximately one hour. There are no restrictions for this procedure.  This will be done at our [facility] location:  [address]  ZIO XT- Long Term Monitor  Instructions   Your physician has requested you wear a ZIO patch monitor for _3__ days.  This is a single patch monitor.   [employer] supplies one patch monitor per enrollment. Additional stickers are not available. Please do not apply patch if you will be having a Nuclear Stress Test, Echocardiogram, Cardiac CT, MRI, or Chest Xray during the period you would be wearing the monitor. The patch cannot be worn during these tests. You cannot remove and re-apply the ZIO XT patch monitor.  Your ZIO patch monitor will be sent Fed Ex from [employer] directly to your home address. It may take 3-5 days to receive your monitor after you have been enrolled.  Once you have received your monitor, please review the enclosed instructions. Your monitor has already been registered assigning a specific monitor serial # to you.  Billing and Patient Assistance Program Information   We have supplied [employer] with any of your insurance information on file for billing purposes. [employer] offers a sliding scale Patient Assistance Program for patients that do not have insurance, or whose insurance does not completely cover the cost of the ZIO monitor.   You must apply for the Patient Assistance Program to qualify for this discounted rate.     To apply, please call [employer] at [phone], select option 4, then select option 2, and ask to apply for Patient Assistance Program.  Meredeth Ide will ask your household income, and how many people are in your household.  They will quote your out-of-pocket cost based on that information.  [employer] will also be able to set up a 57-month, interest-free payment plan if needed.  Applying the monitor   Shave hair from upper left chest.  Hold abrader disc by orange tab. Rub abrader in 40 strokes over the upper left chest as indicated in your monitor instructions.  Clean area with 4 enclosed alcohol pads. Let dry.  Apply patch as indicated in monitor instructions. Patch will be placed under  collarbone on left side of chest with arrow pointing upward.  Rub patch adhesive wings for 2 minutes. Remove white label marked "1". Remove the white label marked "2". Rub patch adhesive wings for 2 additional minutes.  While looking in a  mirror, press and release button in center of patch. A small green light will flash 3-4 times. This will be your only indicator that the monitor has been turned on. ?  Do not shower for the first 24 hours. You may shower after the first 24 hours.  Press the button if you feel a symptom. You will hear a small click. Record Date, Time and Symptom in the Patient Logbook.  When you are ready to remove the patch, follow instructions on the last 2 pages of the Patient Logbook. Stick patch monitor onto the last page of Patient Logbook.  Place Patient Logbook in the blue and white box.  Use locking tab on box and tape box closed securely.  The blue and white box has prepaid postage on it. Please place it in the mailbox as soon as possible. Your physician should have your test results approximately 7 days after the monitor has been mailed back to [employer]  Call [facility] Customer Care at [phone] if you have questions regarding your ZIO XT patch monitor. Call them immediately if you see an orange light blinking on your monitor.  If your monitor falls off in less than 4 days, contact our Monitor department at [phone]. ?If your monitor becomes loose or falls off after 4 days call [employer] at [phone] for suggestions on securing your monitor.?  Coronary CTA-see instructions below  Follow-Up: At [facility], you and your health needs are our priority.  As part of our continuing mission to provide you with exceptional heart care, we have created designated Provider Care Teams.  These Care Teams include your primary Cardiologist (physician) and Advanced Practice Providers (APPs -  Physician Assistants and Nurse Practitioners) who all work together to  provide you with the care you need, when you need it.  We recommend signing up for the patient portal called "MyChart".  Sign up information is provided on this After Visit Summary.  MyChart is used to connect with patients for Virtual Visits (Telemedicine).  Patients are able to view lab/test results, encounter notes, upcoming appointments, etc.  Non-urgent messages can be sent to your provider as well.   To learn more about what you can do with MyChart, go to [email or web address].    Your next appointment:   3 month(s)  The format for your next appointment:   In Person  Provider:   Epifanio Lesches, MD   Other Instructions Your cardiac CT will be scheduled at one of the below locations:   [facility] [address] [place], [place] [address] [phone]  If scheduled at [facility], please arrive at the [facility] main entrance (entrance A) of [facility] 30 minutes prior to test start time. Proceed to the [facility] Radiology Department (first floor) to check-in and test prep.  Please follow these instructions carefully (unless otherwise directed):  On the Night Before the Test: . Be sure to Drink plenty of water. . Do not consume any caffeinated/decaffeinated beverages or chocolate 12 hours prior to your test. . Do not take any antihistamines 12 hours prior to your test.  On the Day of the Test: . Drink plenty of water until 1 hour prior to the test. . Do not eat any food 4 hours prior to the test. . You may take your regular medications prior to the test.  . Take metoprolol (Lopressor) two hours prior to test. . HOLD Furosemide/Hydrochlorothiazide morning of the test. . FEMALES- please wear underwire-free bra if available  After the Test: . Drink plenty of water. . After receiving IV contrast, you may experience a mild flushed feeling. This is normal. . On occasion, you may experience a mild rash up to 24 hours after the  test. This is not dangerous. If this occurs, you can take Benadryl 25 mg and increase your fluid intake. . If you experience trouble breathing, this can be serious. If it is severe call 911 IMMEDIATELY. If it is mild, please call our office. . If you take any of these medications: Glipizide/Metformin, Avandament, Glucavance, please do not take 48 hours after completing test unless otherwise instructed.   Once we have confirmed authorization from your insurance company, we will call you to set up a date and time for your test. Based on how quickly your insurance processes prior authorizations requests, please allow up to 4 weeks to be contacted for scheduling your Cardiac CT appointment. Be advised that routine Cardiac CT appointments could be scheduled as many as 8 weeks after your provider has ordered it.  For non-scheduling related questions, please contact the cardiac imaging nurse navigator should you have any questions/concerns: Rockwell Alexandria, Cardiac Imaging Nurse Navigator Larey Brick, Cardiac Imaging Nurse Navigator  [facility] V[facility] Services Direct Office Dial: [phone]   For scheduling needs, including cancellations and rescheduling, please call [place], [phone].        Danelle Earthly [place],acting as a Neurosurgeon for Little Ishikawa, MD.,have documented all relevant documentation on the behalf of Little Ishikawa, MD,as directed by  Little Ishikawa, MD while in the presence of Little Ishikawa, MD.  I, Little Ishikawa, MD, have reviewed all documentation for this visit. The documentation on 05/28/21 for the exam, diagnosis, procedures, and orders are all accurate and complete.   Signed, Little Ishikawa, MD  05/28/2021 2:14 PM    [facility]

## 2021-05-28 NOTE — Progress Notes (Deleted)
Cardiology Office Note:    Date:  05/28/2021   ID:  Andrea Rhodes, DOB [birth year], MRN [id number]  PCP:  Clayborn Heron, MD  Cardiologist:  None  Electrophysiologist:  None   Referring MD: Dennie Maizes, NP   No chief complaint on file. ***  History of Present Illness:    Andrea Rhodes is a 50 y.o. female with a hx of ***  Past Medical History:  Diagnosis Date  . ARTHRITIS   . ASTHMA, UNSPECIFIED   . Carpal tunnel syndrome   . CONSTIPATION   . FATTY LIVER DISEASE   . Fibromyalgia   . GERD   . HYPERTENSION   . Irritable bowel syndrome   . NEPHROLITHIASIS, HX OF   . OBESITY     Past Surgical History:  Procedure Laterality Date  . BLADDER SURGERY    . laparoscopy 1984    . VAGINAL HYSTERECTOMY      Current Medications: No outpatient medications have been marked as taking for the 05/28/21 encounter (Appointment) with Little Ishikawa, MD.     Allergies:   Meperidine hcl, Nystatin, and Other   Social History   Socioeconomic History  . Marital status: Married    Spouse name: Not on file  . Number of children: Not on file  . Years of education: Not on file  . Highest education level: Not on file  Occupational History  . Not on file  Tobacco Use  . Smoking status: Never Smoker  . Smokeless tobacco: Never Used  Substance and Sexual Activity  . Alcohol use: No    Alcohol/week: 0.0 standard drinks  . Drug use: No  . Sexual activity: Not on file  Other Topics Concern  . 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     Social Determinants of Health   Financial Resource Strain: Not on file  Food Insecurity: Not on file  Transportation Needs: Not on file  Physical Activity: Not on file  Stress: Not on file  Social Connections: Not on file     Family History: The patient's ***family history includes Breast cancer (age of onset: 47) in her cousin;  Cancer in an other family member; Diabetes in her mother; Glaucoma in her maternal grandmother; Hypertension in her mother; Lung cancer in her father; Multiple sclerosis in her maternal aunt.  ROS:   Please see the history of present illness.    *** All other systems reviewed and are negative.  EKGs/Labs/Other Studies Reviewed:    The following studies were reviewed today: ***  EKG:  EKG is *** ordered today.  The ekg ordered today demonstrates ***  Recent Labs: No results found for requested labs within last 8760 hours.  Recent Lipid Panel No results found for: CHOL, TRIG, HDL, CHOLHDL, VLDL, LDLCALC, LDLDIRECT  Physical Exam:    VS:  There were no vitals taken for this visit.    Wt Readings from Last 3 Encounters:  07/01/20 190 lb (86.2 kg)  06/17/20 192 lb (87.1 kg)  10/25/16 201 lb 3.2 oz (91.3 kg)     GEN: *** Well nourished, well developed in no acute distress HEENT: Normal NECK: No JVD; No carotid bruits LYMPHATICS: No lymphadenopathy CARDIAC: ***RRR, no murmurs, rubs, gallops RESPIRATORY:  Clear to auscultation without rales, wheezing or rhonchi  ABDOMEN: Soft, non-tender, non-distended MUSCULOSKELETAL:  No edema; No deformity  SKIN: Warm and dry NEUROLOGIC:  Alert and oriented  x 3 PSYCHIATRIC:  Normal affect   ASSESSMENT:    No diagnosis found. PLAN:    In order of problems listed above:  1. ***   Medication Adjustments/Labs and Tests Ordered: Current medicines are reviewed at length with the patient today.  Concerns regarding medicines are outlined above.  No orders of the defined types were placed in this encounter.  No orders of the defined types were placed in this encounter.   There are no Patient Instructions on file for this visit.   Signed, Little Ishikawa, MD  05/28/2021 8:10 AM    [facility]

## 2021-05-30 DIAGNOSIS — R002 Palpitations: Secondary | ICD-10-CM

## 2021-06-15 ENCOUNTER — Other Ambulatory Visit: Payer: Self-pay | Admitting: Obstetrics and Gynecology

## 2021-06-15 DIAGNOSIS — Z1231 Encounter for screening mammogram for malignant neoplasm of breast: Secondary | ICD-10-CM

## 2021-06-16 ENCOUNTER — Telehealth: Payer: Self-pay | Admitting: Cardiology

## 2021-06-16 DIAGNOSIS — I10 Essential (primary) hypertension: Secondary | ICD-10-CM

## 2021-06-16 DIAGNOSIS — R079 Chest pain, unspecified: Secondary | ICD-10-CM

## 2021-06-16 NOTE — Telephone Encounter (Signed)
Patient states she recently had lab work at 2 offices and she would like to know if it can be considered since she is scheduled for a CT on 06/24/21. She states she had lab work with [facility] (Dr. Deanne Coffer) in May and lab work with [facility], [place]) yesterday. She does not know exactly which panels she had, but she is hoping we can contact the offices to find out. She states she is trying to avoid repeating lab work if it is not necessary. Please return call to patient with any updates.

## 2021-06-16 NOTE — Telephone Encounter (Signed)
Returned call to patient and advised that a bmet within 30 days of coronary CT  is required due to her history of taking metformin and lisinopril. She had a CMET on 5/5. Patient would like to get lab work done at [facility]. I advised her of the procedure for getting labs which she plans to complete on 6/23. She thanked me for the call.

## 2021-06-18 ENCOUNTER — Other Ambulatory Visit: Payer: Self-pay

## 2021-06-18 DIAGNOSIS — I10 Essential (primary) hypertension: Secondary | ICD-10-CM

## 2021-06-18 DIAGNOSIS — R079 Chest pain, unspecified: Secondary | ICD-10-CM

## 2021-06-19 LAB — BASIC METABOLIC PANEL
BUN/Creatinine Ratio: 12 (ref 9–23)
BUN: 8 mg/dL (ref 6–24)
CO2: 24 mmol/L (ref 20–29)
Calcium: 9.8 mg/dL (ref 8.7–10.2)
Chloride: 99 mmol/L (ref 96–106)
Creatinine, Ser: 0.68 mg/dL (ref 0.57–1.00)
Glucose: 79 mg/dL (ref 65–99)
Potassium: 4.4 mmol/L (ref 3.5–5.2)
Sodium: 139 mmol/L (ref 134–144)
eGFR: 105 mL/min/{1.73_m2} (ref >59)

## 2021-06-22 ENCOUNTER — Telehealth (HOSPITAL_COMMUNITY): Payer: Self-pay | Admitting: *Deleted

## 2021-06-22 NOTE — Telephone Encounter (Signed)
Attempted to call patient regarding upcoming cardiac CT appointment. °Left message on voicemail with name and callback number ° °Adiyah Lame RN Navigator Cardiac Imaging °[facility] °[phone] Office °[phone] Cell ° °

## 2021-06-22 NOTE — Telephone Encounter (Signed)
Patient returning call regarding upcoming cardiac imaging study; pt verbalizes understanding of appt date/time, parking situation and where to check in, pre-test NPO status and medications ordered, and verified current allergies; name and call back number provided for further questions should they arise  Larey Brick RN Navigator Cardiac Imaging [facility] [phone] office [phone] cell  Pt to take 100mg  metoprolol tartrate 2 hours prior to cardiac CT.

## 2021-06-24 ENCOUNTER — Other Ambulatory Visit: Payer: Self-pay

## 2021-06-24 ENCOUNTER — Ambulatory Visit (HOSPITAL_COMMUNITY)
Admission: RE | Admit: 2021-06-24 | Discharge: 2021-06-24 | Disposition: A | Discharge status: Home / Self Care | Payer: 59 | Source: Ambulatory Visit | Attending: Cardiology | Admitting: Cardiology

## 2021-06-24 ENCOUNTER — Ambulatory Visit (HOSPITAL_BASED_OUTPATIENT_CLINIC_OR_DEPARTMENT_OTHER): Payer: 59

## 2021-06-24 DIAGNOSIS — R079 Chest pain, unspecified: Secondary | ICD-10-CM

## 2021-06-24 DIAGNOSIS — R002 Palpitations: Secondary | ICD-10-CM | POA: Insufficient documentation

## 2021-06-24 LAB — ECHOCARDIOGRAM COMPLETE
Area-P 1/2: 4.28 cm2
S' Lateral: 2.5 cm

## 2021-06-24 MED ORDER — IOHEXOL 350 MG/ML SOLN
95.0000 mL | Freq: Once | INTRAVENOUS | Status: AC | PRN
  Administered 2021-06-24: 95 mL via INTRAVENOUS

## 2021-06-24 MED ORDER — NITROGLYCERIN 0.4 MG SL SUBL
SUBLINGUAL_TABLET | SUBLINGUAL | Status: AC
  Administered 2021-06-24: 0.8 mg via SUBLINGUAL
  Filled 2021-06-24: qty 2

## 2021-06-24 MED ORDER — PERFLUTREN LIPID MICROSPHERE
1.0000 mL | INTRAVENOUS | Status: AC | PRN
  Administered 2021-06-24: 2 mL via INTRAVENOUS

## 2021-06-24 MED ORDER — NITROGLYCERIN 0.4 MG SL SUBL: 0.8000 mg | SUBLINGUAL_TABLET | Freq: Once | SUBLINGUAL | Status: AC

## 2021-06-24 NOTE — Progress Notes (Signed)
Patient tolerated CT well. Vital signs stable encourage to drink water throughout day.Reasons explained and verbalized understanding. Ambulated steady gait.   

## 2021-09-08 ENCOUNTER — Ambulatory Visit: Payer: 59 | Admitting: Cardiology

## 2021-09-28 ENCOUNTER — Ambulatory Visit: Payer: 59

## 2021-11-16 ENCOUNTER — Ambulatory Visit: Payer: 59

## 2021-11-30 DIAGNOSIS — M79643 Pain in unspecified hand: Secondary | ICD-10-CM | POA: Diagnosis not present

## 2021-11-30 DIAGNOSIS — L4050 Arthropathic psoriasis, unspecified: Secondary | ICD-10-CM | POA: Diagnosis not present

## 2021-11-30 DIAGNOSIS — M25569 Pain in unspecified knee: Secondary | ICD-10-CM | POA: Diagnosis not present

## 2021-11-30 DIAGNOSIS — Z79899 Other long term (current) drug therapy: Secondary | ICD-10-CM | POA: Diagnosis not present

## 2021-12-07 ENCOUNTER — Ambulatory Visit
Admission: RE | Admit: 2021-12-07 | Discharge: 2021-12-07 | Disposition: A | Discharge status: Home / Self Care | Payer: BC Managed Care – PPO | Source: Ambulatory Visit | Attending: Obstetrics and Gynecology | Admitting: Obstetrics and Gynecology

## 2021-12-07 DIAGNOSIS — Z1231 Encounter for screening mammogram for malignant neoplasm of breast: Secondary | ICD-10-CM | POA: Diagnosis not present

## 2021-12-15 DIAGNOSIS — E7800 Pure hypercholesterolemia, unspecified: Secondary | ICD-10-CM | POA: Diagnosis not present

## 2021-12-15 DIAGNOSIS — M797 Fibromyalgia: Secondary | ICD-10-CM | POA: Diagnosis not present

## 2021-12-15 DIAGNOSIS — E1169 Type 2 diabetes mellitus with other specified complication: Secondary | ICD-10-CM | POA: Diagnosis not present

## 2021-12-15 DIAGNOSIS — I10 Essential (primary) hypertension: Secondary | ICD-10-CM | POA: Diagnosis not present

## 2022-01-20 DIAGNOSIS — M797 Fibromyalgia: Secondary | ICD-10-CM | POA: Diagnosis not present

## 2022-01-20 DIAGNOSIS — M79643 Pain in unspecified hand: Secondary | ICD-10-CM | POA: Diagnosis not present

## 2022-01-20 DIAGNOSIS — R059 Cough, unspecified: Secondary | ICD-10-CM | POA: Diagnosis not present

## 2022-01-20 DIAGNOSIS — L4050 Arthropathic psoriasis, unspecified: Secondary | ICD-10-CM | POA: Diagnosis not present

## 2022-01-20 DIAGNOSIS — M791 Myalgia, unspecified site: Secondary | ICD-10-CM | POA: Diagnosis not present

## 2022-02-05 DIAGNOSIS — H01024 Squamous blepharitis left upper eyelid: Secondary | ICD-10-CM | POA: Diagnosis not present

## 2022-02-05 DIAGNOSIS — H01021 Squamous blepharitis right upper eyelid: Secondary | ICD-10-CM | POA: Diagnosis not present

## 2022-02-05 DIAGNOSIS — H04123 Dry eye syndrome of bilateral lacrimal glands: Secondary | ICD-10-CM | POA: Diagnosis not present

## 2022-02-05 DIAGNOSIS — E119 Type 2 diabetes mellitus without complications: Secondary | ICD-10-CM | POA: Diagnosis not present

## 2022-02-10 ENCOUNTER — Institutional Professional Consult (permissible substitution): Payer: BC Managed Care – PPO | Admitting: Student

## 2022-02-12 ENCOUNTER — Encounter: Payer: Self-pay | Admitting: Physician Assistant

## 2022-02-12 ENCOUNTER — Ambulatory Visit: Payer: BC Managed Care – PPO | Admitting: Physician Assistant

## 2022-02-12 VITALS — BP 120/78 | HR 103 | Ht 63.0 in | Wt 198.5 lb

## 2022-02-12 DIAGNOSIS — R059 Cough, unspecified: Secondary | ICD-10-CM

## 2022-02-12 DIAGNOSIS — K219 Gastro-esophageal reflux disease without esophagitis: Secondary | ICD-10-CM

## 2022-02-12 MED ORDER — OMEPRAZOLE 40 MG PO CPDR: 40.0000 mg | DELAYED_RELEASE_CAPSULE | Freq: Every day | ORAL | 5 refills | Status: DC

## 2022-02-12 NOTE — Progress Notes (Signed)
Chief Complaint: GERD  HPI:    Andrea Rhodes is a 51 year old African-American female with a past medical history of reflux and others listed below, known to Dr. Christella Hartigan, who presents clinic today with a complaint of reflux.    07/01/2020 colonoscopy for screening with normal colon and a small amount of deflated external hemorrhoid tissue.  Repeat recommended in 10 years.    Today, the patient explains that she has always had some reflux symptoms and typically uses over-the-counter Zegerid 40 mg once a day and it has helped.  Most recently her rheumatoid arthritis medicine was changed from Cosentyx to [place] and she developed a cough which she read could be a side effect from this medicine so initially she thought it was from that.  She followed up with her rheumatologist who said that he thought more than likely it was her reflux.  Around that time had also been on Prednisone 5 mg for an RA flare.  Tells me that since that time she has continued with a cough, her PCP started her on Prilosec 20 mg once a day which has helped with the cough, but symptoms seem to come back around 3-5 and are there in the morning when she wakes up before eating or taking her medicine.  She did take 40 mg one day and it seemed to help a lot better but she was not sure if she should continue this.    Denies fever, chills, weight loss, blood in her stool or abdominal pain.  Past Medical History:  Diagnosis Date   ARTHRITIS    ASTHMA, UNSPECIFIED    Carpal tunnel syndrome    CONSTIPATION    FATTY LIVER DISEASE    Fibromyalgia    GERD    HYPERTENSION    Irritable bowel syndrome    NEPHROLITHIASIS, HX OF    OBESITY     Past Surgical History:  Procedure Laterality Date   BLADDER SURGERY     laparoscopy 1984     VAGINAL HYSTERECTOMY      Current Outpatient Medications  Medication Sig Dispense Refill   albuterol (PROVENTIL HFA;VENTOLIN HFA) 108 (90 BASE) MCG/ACT inhaler Inhale 2 puffs into the lungs every  4 (four) hours as needed for wheezing or shortness of breath.     Cholecalciferol (VITAMIN D) 2000 UNITS CAPS Take 1 capsule by mouth daily.     folic acid (FOLVITE) 1 MG tablet Take 1 mg by mouth daily. Patient takes 2 tablets daily     lisinopril-hydrochlorothiazide (ZESTORETIC) 20-25 MG tablet Take 1 tablet by mouth daily.     metFORMIN (GLUCOPHAGE) 500 MG tablet Take 500 mg by mouth daily.     methotrexate 2.5 MG tablet Take 2.5 mg by mouth See admin instructions. Patient total 10 mg once weekly     Omega-3 Fatty Acids (OMEGA-3 FISH OIL) 500 MG CAPS Take 690mg  cap daily     omeprazole (PRILOSEC) 20 MG capsule Take 20 mg by mouth daily.     pravastatin (PRAVACHOL) 40 MG tablet Take 40 mg by mouth daily.     predniSONE (DELTASONE) 5 MG tablet Take 5 mg by mouth daily as needed.     Risankizumab-rzaa (SKYRIZI PEN) 150 MG/ML SOAJ See admin instructions.     tiZANidine (ZANAFLEX) 2 MG tablet Take 2 mg by mouth daily as needed.     No current facility-administered medications for this visit.    Allergies as of 02/12/2022 - Review Complete 02/12/2022  Allergen Reaction Noted  Meperidine hcl  01/07/2011   Nystatin  01/07/2011   Other  11/15/2014    Family History  Problem Relation Age of Onset   Diabetes Mother    Hypertension Mother    Lung cancer Father        PTSD   Glaucoma Maternal Grandmother    Multiple sclerosis Maternal Aunt        no family hx   Cancer Other        1st cousin, Breast CA, deceased at 74y   Breast cancer Cousin 31    Social History   Socioeconomic History   Marital status: Married    Spouse name: Not on file   Number of children: Not on file   Years of education: Not on file   Highest education level: Not on file  Occupational History   Not on file  Tobacco Use   Smoking status: Never   Smokeless tobacco: Never  Substance and Sexual Activity   Alcohol use: No    Alcohol/week: 0.0 standard drinks   Drug use: No   Sexual activity: Not on file   Other Topics Concern   Not on file  Social History Narrative   Caffeine 3 x week, 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     Social Determinants of Health   Financial Resource Strain: Not on file  Food Insecurity: Not on file  Transportation Needs: Not on file  Physical Activity: Not on file  Stress: Not on file  Social Connections: Not on file  Intimate Partner Violence: Not on file    Review of Systems:    Constitutional: No weight loss, fever or chills Cardiovascular: No chest pain Respiratory: No SOB  Gastrointestinal: See HPI and otherwise negative   Physical Exam:  Vital signs: BP 120/78    Pulse (!) 103    Ht 5\' 3"  (1.6 m)    Wt 198 lb 8 oz (90 kg)    SpO2 98%    BMI 35.16 kg/m    Constitutional:   Pleasant overweight AA female appears to be in NAD, Well developed, Well nourished, alert and cooperative Respiratory: Respirations even and unlabored. Lungs clear to auscultation bilaterally.   No wheezes, crackles, or rhonchi.  Cardiovascular: Normal S1, S2. No MRG. Regular rate and rhythm. No peripheral edema, cyanosis or pallor.  Gastrointestinal:  Soft, nondistended, nontender. No rebound or guarding. Normal bowel sounds. No appreciable masses or hepatomegaly. Rectal:  Not performed.  Psychiatric: Demonstrates good judgement and reason without abnormal affect or behaviors.  No recent labs or imaging.  Assessment: 1.  GERD: With below 2.  Cough: Increased over the past month and a half, initially thought was a side effect from her RA medicine, but had been on Prednisone and has chronic reflux, since starting Omeprazole this is helped decrease it some; likely related to reflux  Plan: 1.  Recommend patient have an EGD given that she has had reflux symptoms chronically her whole life and never had evaluation.  Also since she had recent increase in symptoms with cough.  Patient scheduled with Dr. in April per her request.  Did provide the  patient a detailed list of risks for the procedure and she agrees to proceed. Patient is appropriate for endoscopic procedure(s) in the ambulatory ([facility]) setting.  2.  Increased Omeprazole to 40 mg once daily, 30-60 minutes before breakfast #30 with 5 refills. 3.  Patient to follow in clinic per recommendations after time of procedure.  Hyacinth Meeker, PA-C [facility] 02/12/2022, 3:30 PM  Cc: Clayborn Heron, MD

## 2022-02-12 NOTE — Patient Instructions (Signed)
We have sent the following medications to your pharmacy for you to pick up at your convenience: Omeprazole 40 mg daily 30-60 minutes before breakfast.   You have been scheduled for an endoscopy. Please follow written instructions given to you at your visit today. If you use inhalers (even only as needed), please bring them with you on the day of your procedure.  If you are age 51 or younger, your body mass index should be between 19-25. Your Body mass index is 35.16 kg/m. If this is out of the aformentioned range listed, please consider follow up with your Primary Care Provider.   ________________________________________________________  The [facility] providers would like to encourage you to use [facility] to communicate with providers for non-urgent requests or questions.  Due to long hold times on the telephone, sending your provider a message by [facility] may be a faster and more efficient way to get a response.  Please allow 48 business hours for a response.  Please remember that this is for non-urgent requests.  _______________________________________________________

## 2022-02-15 NOTE — Progress Notes (Signed)
I agree with the above note, plan 

## 2022-03-08 ENCOUNTER — Other Ambulatory Visit: Payer: Self-pay

## 2022-03-08 MED ORDER — OMEPRAZOLE 40 MG PO CPDR: 40.0000 mg | DELAYED_RELEASE_CAPSULE | Freq: Every day | ORAL | 1 refills | Status: DC

## 2022-03-29 DIAGNOSIS — M79643 Pain in unspecified hand: Secondary | ICD-10-CM | POA: Diagnosis not present

## 2022-03-29 DIAGNOSIS — M7062 Trochanteric bursitis, left hip: Secondary | ICD-10-CM | POA: Diagnosis not present

## 2022-03-29 DIAGNOSIS — L4050 Arthropathic psoriasis, unspecified: Secondary | ICD-10-CM | POA: Diagnosis not present

## 2022-03-29 DIAGNOSIS — M0609 Rheumatoid arthritis without rheumatoid factor, multiple sites: Secondary | ICD-10-CM | POA: Diagnosis not present

## 2022-03-29 DIAGNOSIS — M791 Myalgia, unspecified site: Secondary | ICD-10-CM | POA: Diagnosis not present

## 2022-03-31 ENCOUNTER — Ambulatory Visit (AMBULATORY_SURGERY_CENTER): Payer: BC Managed Care – PPO | Admitting: Gastroenterology

## 2022-03-31 ENCOUNTER — Encounter: Payer: Self-pay | Admitting: Gastroenterology

## 2022-03-31 VITALS — BP 107/51 | HR 68 | Temp 97.8°F | Resp 18 | Ht 63.0 in | Wt 198.0 lb

## 2022-03-31 DIAGNOSIS — K219 Gastro-esophageal reflux disease without esophagitis: Secondary | ICD-10-CM

## 2022-03-31 DIAGNOSIS — K2970 Gastritis, unspecified, without bleeding: Secondary | ICD-10-CM | POA: Diagnosis not present

## 2022-03-31 DIAGNOSIS — K317 Polyp of stomach and duodenum: Secondary | ICD-10-CM | POA: Diagnosis not present

## 2022-03-31 DIAGNOSIS — K2950 Unspecified chronic gastritis without bleeding: Secondary | ICD-10-CM | POA: Diagnosis not present

## 2022-03-31 MED ORDER — SODIUM CHLORIDE 0.9 % IV SOLN: 500.0000 mL | Freq: Once | INTRAVENOUS | Status: DC

## 2022-03-31 NOTE — Progress Notes (Signed)
Called to room to assist during endoscopic procedure.  Patient ID and intended procedure confirmed with present staff. Received instructions for my participation in the procedure from the performing physician.  

## 2022-03-31 NOTE — Patient Instructions (Addendum)
Handout on gastritis provided  ? ?Await pathology results.  ? ?Continue current medications.  ? ?Take pepcid/famotidine 20 mg pill in the afternoon for acid symptoms.Over the counter medication.  ? ? ?YOU HAD AN ENDOSCOPIC PROCEDURE TODAY AT THE [facility]:   Refer to the procedure report that was given to you for any specific questions about what was found during the examination.  If the procedure report does not answer your questions, please call your gastroenterologist to clarify.  If you requested that your care partner not be given the details of your procedure findings, then the procedure report has been included in a sealed envelope for you to review at your convenience later. ? ?YOU SHOULD EXPECT: Some feelings of bloating in the abdomen. Passage of more gas than usual.  Walking can help get rid of the air that was put into your GI tract during the procedure and reduce the bloating. If you had a lower endoscopy (such as a colonoscopy or flexible sigmoidoscopy) you may notice spotting of blood in your stool or on the toilet paper. If you underwent a bowel prep for your procedure, you may not have a normal bowel movement for a few days. ? ?Please Note:  You might notice some irritation and congestion in your nose or some drainage.  This is from the oxygen used during your procedure.  There is no need for concern and it should clear up in a day or so. ? ?SYMPTOMS TO REPORT IMMEDIATELY: ? ?Following upper endoscopy (EGD) ? Vomiting of blood or coffee ground material ? New chest pain or pain under the shoulder blades ? Painful or persistently difficult swallowing ? New shortness of breath ? Fever of 100?F or higher ? Black, tarry-looking stools ? ?For urgent or emergent issues, a gastroenterologist can be reached at any hour by calling [phone]. ?Do not use MyChart messaging for urgent concerns.  ? ? ?DIET:  We do recommend a small meal at first, but then you may proceed to your regular diet.   Drink plenty of fluids but you should avoid alcoholic beverages for 24 hours. ? ?ACTIVITY:  You should plan to take it easy for the rest of today and you should NOT DRIVE or use heavy machinery until tomorrow (because of the sedation medicines used during the test).   ? ?FOLLOW UP: ?Our staff will call the number listed on your records 48-72 hours following your procedure to check on you and address any questions or concerns that you may have regarding the information given to you following your procedure. If we do not reach you, we will leave a message.  We will attempt to reach you two times.  During this call, we will ask if you have developed any symptoms of COVID 19. If you develop any symptoms (ie: fever, flu-like symptoms, shortness of breath, cough etc.) before then, please call [phone].  If you test positive for Covid 19 in the 2 weeks post procedure, please call and report this information to [place].   ? ?If any biopsies were taken you will be contacted by phone or by letter within the next 1-3 weeks.  Please call us at [phone] if you have not heard about the biopsies in 3 weeks.  ? ? ?SIGNATURES/CONFIDENTIALITY: ?You and/or your care partner have signed paperwork which will be entered into your electronic medical record.  These signatures attest to the fact that that the information above on your After Visit Summary has been reviewed and  is understood.  Full responsibility of the confidentiality of this discharge information lies with you and/or your care-partner. ? ? ?

## 2022-03-31 NOTE — Progress Notes (Signed)
Pt in recovery with monitors in place, VSS. Report given to receiving RN. Bite guard was placed with pt awake to ensure comfort. No dental or soft tissue damage noted. 

## 2022-03-31 NOTE — Op Note (Addendum)
[facility] ?Patient Name: Andrea Rhodes ?Procedure Date: 03/31/2022 8:21 AM ?MRN: [id number] ?Endoscopist: Rachael Fee , MD ?Age: 51 ?Referring MD:  ?Date of Birth: May 02, 1971 ?Gender: Female ?Account #: [id number] ?Procedure:                Upper GI endoscopy ?Indications:              Heartburn ?Medicines:                Monitored Anesthesia Care ?Procedure:                Pre-Anesthesia Assessment: ?                          - Prior to the procedure, a History and Physical  ?                          was performed, and patient medications and  ?                          allergies were reviewed. The patient's tolerance of  ?                          previous anesthesia was also reviewed. The risks  ?                          and benefits of the procedure and the sedation  ?                          options and risks were discussed with the patient.  ?                          All questions were answered, and informed consent  ?                          was obtained. Prior Anticoagulants: The patient has  ?                          taken no previous anticoagulant or antiplatelet  ?                          agents. ASA Grade Assessment: II - A patient with  ?                          mild systemic disease. After reviewing the risks  ?                          and benefits, the patient was deemed in  ?                          satisfactory condition to undergo the procedure. ?                          After obtaining informed consent, the endoscope was  ?  passed under direct vision. Throughout the  ?                          procedure, the patient's blood pressure, pulse, and  ?                          oxygen saturations were monitored continuously. The  ?                          Endoscope was introduced through the mouth, and  ?                          advanced to the second part of duodenum. The upper  ?                          GI endoscopy was accomplished without  difficulty.  ?                          The patient tolerated the procedure well. ?Scope In: ?Scope Out: ?Findings:                 Mild inflammation characterized by erythema and  ?                          granularity was found in the gastric antrum.  ?                          Biopsies were taken with a cold forceps for  ?                          histology. ?                          A few small typical appearing fundic gland polyps  ?                          were noted in the proximal stomach. ?                          The exam was otherwise without abnormality. ?Complications:            No immediate complications. Estimated blood loss:  ?                          None. ?Estimated Blood Loss:     Estimated blood loss: none. ?Impression:               - Mild, non-specific distal gastritis. Biopsied to  ?                          check for H. pylori. ?                          - A few small typical appearing fundic gland polyps  ?  were noted in the proximal stomach. ?                          - The examination was otherwise normal. ?                          You have non-erosive GERD (acid is washing onto  ?                          your esophagus and causing symptoms but not  ?                          damaging your esophagus. ?Recommendation:           - Patient has a contact number available for  ?                          emergencies. The signs and symptoms of potential  ?                          delayed complications were discussed with the  ?                          patient. Return to normal activities tomorrow.  ?                          Written discharge instructions were provided to the  ?                          patient. ?                          - Resume previous diet. ?                          - Continue present medications. Continue omeprazole  ?                           pill before BF every morning. Losing weight  ?                          would likely improve  your GERD. Try taking a single  ?                          pepcid/famotidine  pill in the afternoon for  ?                          acid symptoms. ?                          - Await pathology results. If biopsies show H.  ?                          pylori you will be started on appropriate  ?  antibiotics. ?Rachael Fee, MD ?03/31/2022 8:36:10 AM ?This report has been signed electronically. ?

## 2022-03-31 NOTE — Progress Notes (Signed)
HPI: ?This is a woman with obesity and chronic GERD ? ? ?ROS: complete GI ROS as described in HPI, all other review negative. ? ?Constitutional:  No unintentional weight loss ? ? ?Past Medical History:  ?Diagnosis Date  ? ARTHRITIS   ? Arthritis   ? Asthma   ? ASTHMA, UNSPECIFIED   ? Carpal tunnel syndrome   ? CONSTIPATION   ? Diabetes mellitus without complication (HCC)   ? FATTY LIVER DISEASE   ? Fibromyalgia   ? GERD   ? HYPERTENSION   ? Irritable bowel syndrome   ? NEPHROLITHIASIS, HX OF   ? OBESITY   ? ? ?Past Surgical History:  ?Procedure Laterality Date  ? BLADDER SURGERY    ? laparoscopy 1984    ? VAGINAL HYSTERECTOMY    ? ? ?Current Outpatient Medications  ?Medication Sig Dispense Refill  ? Cholecalciferol (VITAMIN D) 2000 UNITS CAPS Take 1 capsule by mouth daily.    ? folic acid (FOLVITE) 1 MG tablet Take 1 mg by mouth daily. Patient takes 2 tablets daily    ? lisinopril-hydrochlorothiazide (ZESTORETIC) 20-25 MG tablet Take 1 tablet by mouth daily.    ? metFORMIN (GLUCOPHAGE) 500 MG tablet Take 500 mg by mouth daily.    ? methotrexate 2.5 MG tablet Take 2.5 mg by mouth See admin instructions. Patient total 10 mg once weekly    ? Omega-3 Fatty Acids (OMEGA-3 FISH OIL) 500 MG CAPS Take 690mg  cap daily    ? omeprazole (PRILOSEC) 40 MG capsule Take 1 capsule (40 mg total) by mouth daily. 90 capsule 1  ? pravastatin (PRAVACHOL) 40 MG tablet Take 40 mg by mouth daily.    ? albuterol (PROVENTIL HFA;VENTOLIN HFA) 108 (90 BASE) MCG/ACT inhaler Inhale 2 puffs into the lungs every 4 (four) hours as needed for wheezing or shortness of breath.    ? predniSONE (DELTASONE) 5 MG tablet Take 5 mg by mouth daily as needed.    ? Risankizumab-rzaa (SKYRIZI PEN) 150 MG/ML SOAJ See admin instructions.    ? tiZANidine (ZANAFLEX) 2 MG tablet Take 2 mg by mouth daily as needed.    ? ?Current Facility-Administered Medications  ?Medication Dose Route Frequency Provider Last Rate Last Admin  ? 0.9 %  sodium chloride infusion  500 mL  Intravenous Once , MD      ? ? ?Allergies as of 03/31/2022 - Review Complete 03/31/2022  ?Allergen Reaction Noted  ? Meperidine hcl  01/07/2011  ? Nystatin  01/07/2011  ? Other  11/15/2014  ? ? ?Family History  ?Problem Relation Age of Onset  ? Diabetes Mother   ? Hypertension Mother   ? Lung cancer Father   ?     PTSD  ? Multiple sclerosis Maternal Aunt   ?     no family hx  ? Glaucoma Maternal Grandmother   ? Breast cancer Cousin 31  ? Cancer Other   ?     1st cousin, Breast CA, deceased at 60y  ? Colon cancer Neg Hx   ? Esophageal cancer Neg Hx   ? Rectal cancer Neg Hx   ? Stomach cancer Neg Hx   ? ? ?Social History  ? ?Socioeconomic History  ? Marital status: Married  ?  Spouse name: Not on file  ? Number of children: Not on file  ? Years of education: Not on file  ? Highest education level: Not on file  ?Occupational History  ? Not on file  ?Tobacco Use  ?  Smoking status: Never  ? Smokeless tobacco: Never  ?Vaping Use  ? Vaping Use: Never used  ?Substance and Sexual Activity  ? Alcohol use: No  ?  Alcohol/week: 0.0 standard drinks  ? Drug use: No  ? Sexual activity: Not on file  ?Other Topics Concern  ? Not on file  ?Social History Narrative  ? 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    ? ?Social Determinants of Health  ? ?Financial Resource Strain: Not on file  ?Food Insecurity: Not on file  ?Transportation Needs: Not on file  ?Physical Activity: Not on file  ?Stress: Not on file  ?Social Connections: Not on file  ?Intimate Partner Violence: Not on file  ? ? ? ?Physical Exam: ?BP 132/82   Pulse 70   Temp 97.8 ?F (36.6 ?C) (Temporal)   Ht 5\' 3"  (1.6 m)   Wt 198 lb (89.8 kg)   SpO2 97%   BMI 35.07 kg/m?  ?Constitutional: generally well-appearing ?Psychiatric: alert and oriented x3 ?Lungs: CTA bilaterally ?Heart: no MCR ? ?Assessment and plan: ?51 y.o. female with obesity and chronic GERD ? ?EGD evaluation today ? ?Care is appropriate for the  ambulatory setting. ? ?44, MD ?[facility] ?03/31/2022, 8:03 AM ? ? ? ?

## 2022-04-05 ENCOUNTER — Telehealth: Payer: Self-pay | Admitting: *Deleted

## 2022-04-05 ENCOUNTER — Encounter: Payer: Self-pay | Admitting: Gastroenterology

## 2022-04-05 NOTE — Telephone Encounter (Signed)
?  Follow up Call- ? ? ?  03/31/2022  ?  7:45 AM 07/01/2020  ? 11:00 AM  ?Call back number  ?Post procedure Call Back phone  # [phone]  ?Permission to leave phone message Yes Yes  ?  ? ?Patient questions: ? ?Do you have a fever, pain , or abdominal swelling? No. ?Pain Score  0 * ? ?Have you tolerated food without any problems? Yes.   ? ?Have you been able to return to your normal activities? Yes.   ? ?Do you have any questions about your discharge instructions: ?Diet   No. ?Medications  No. ?Follow up visit  No. ? ?Do you have questions or concerns about your Care? No. ? ?Actions: ?* If pain score is 4 or above: ?No action needed, pain <4. ? ? ?

## 2022-05-11 DIAGNOSIS — H04123 Dry eye syndrome of bilateral lacrimal glands: Secondary | ICD-10-CM | POA: Diagnosis not present

## 2022-05-11 DIAGNOSIS — E119 Type 2 diabetes mellitus without complications: Secondary | ICD-10-CM | POA: Diagnosis not present

## 2022-05-11 DIAGNOSIS — D23122 Other benign neoplasm of skin of left lower eyelid, including canthus: Secondary | ICD-10-CM | POA: Diagnosis not present

## 2022-05-11 DIAGNOSIS — Z79899 Other long term (current) drug therapy: Secondary | ICD-10-CM | POA: Diagnosis not present

## 2022-06-14 DIAGNOSIS — E7800 Pure hypercholesterolemia, unspecified: Secondary | ICD-10-CM | POA: Diagnosis not present

## 2022-06-14 DIAGNOSIS — I10 Essential (primary) hypertension: Secondary | ICD-10-CM | POA: Diagnosis not present

## 2022-06-14 DIAGNOSIS — M797 Fibromyalgia: Secondary | ICD-10-CM | POA: Diagnosis not present

## 2022-06-14 DIAGNOSIS — E1169 Type 2 diabetes mellitus with other specified complication: Secondary | ICD-10-CM | POA: Diagnosis not present

## 2022-07-07 ENCOUNTER — Other Ambulatory Visit: Payer: Self-pay | Admitting: Family Medicine

## 2022-07-07 DIAGNOSIS — Z1231 Encounter for screening mammogram for malignant neoplasm of breast: Secondary | ICD-10-CM

## 2022-07-12 DIAGNOSIS — M79643 Pain in unspecified hand: Secondary | ICD-10-CM | POA: Diagnosis not present

## 2022-07-12 DIAGNOSIS — L4050 Arthropathic psoriasis, unspecified: Secondary | ICD-10-CM | POA: Diagnosis not present

## 2022-07-12 DIAGNOSIS — M0609 Rheumatoid arthritis without rheumatoid factor, multiple sites: Secondary | ICD-10-CM | POA: Diagnosis not present

## 2022-07-12 DIAGNOSIS — M791 Myalgia, unspecified site: Secondary | ICD-10-CM | POA: Diagnosis not present

## 2022-08-09 DIAGNOSIS — Z87442 Personal history of urinary calculi: Secondary | ICD-10-CM | POA: Diagnosis not present

## 2022-08-09 DIAGNOSIS — R3121 Asymptomatic microscopic hematuria: Secondary | ICD-10-CM | POA: Diagnosis not present

## 2022-08-09 DIAGNOSIS — M545 Low back pain, unspecified: Secondary | ICD-10-CM | POA: Diagnosis not present

## 2022-08-09 DIAGNOSIS — R3915 Urgency of urination: Secondary | ICD-10-CM | POA: Diagnosis not present

## 2022-09-07 ENCOUNTER — Other Ambulatory Visit: Payer: Self-pay | Admitting: Physician Assistant

## 2022-10-14 DIAGNOSIS — L4050 Arthropathic psoriasis, unspecified: Secondary | ICD-10-CM | POA: Diagnosis not present

## 2022-10-14 DIAGNOSIS — M0609 Rheumatoid arthritis without rheumatoid factor, multiple sites: Secondary | ICD-10-CM | POA: Diagnosis not present

## 2022-10-14 DIAGNOSIS — M79643 Pain in unspecified hand: Secondary | ICD-10-CM | POA: Diagnosis not present

## 2022-10-14 DIAGNOSIS — M791 Myalgia, unspecified site: Secondary | ICD-10-CM | POA: Diagnosis not present

## 2022-11-25 DIAGNOSIS — L308 Other specified dermatitis: Secondary | ICD-10-CM | POA: Diagnosis not present

## 2022-11-25 DIAGNOSIS — E669 Obesity, unspecified: Secondary | ICD-10-CM | POA: Diagnosis not present

## 2022-12-08 ENCOUNTER — Ambulatory Visit
Admission: RE | Admit: 2022-12-08 | Discharge: 2022-12-08 | Disposition: A | Discharge status: Home / Self Care | Payer: BC Managed Care – PPO | Source: Ambulatory Visit | Attending: Family Medicine | Admitting: Family Medicine

## 2022-12-08 DIAGNOSIS — Z1231 Encounter for screening mammogram for malignant neoplasm of breast: Secondary | ICD-10-CM | POA: Diagnosis not present

## 2022-12-08 DIAGNOSIS — Z6833 Body mass index (BMI) 33.0-33.9, adult: Secondary | ICD-10-CM | POA: Diagnosis not present

## 2022-12-08 DIAGNOSIS — Z1272 Encounter for screening for malignant neoplasm of vagina: Secondary | ICD-10-CM | POA: Diagnosis not present

## 2022-12-08 DIAGNOSIS — Z01419 Encounter for gynecological examination (general) (routine) without abnormal findings: Secondary | ICD-10-CM | POA: Diagnosis not present

## 2022-12-15 DIAGNOSIS — M797 Fibromyalgia: Secondary | ICD-10-CM | POA: Diagnosis not present

## 2022-12-15 DIAGNOSIS — E7800 Pure hypercholesterolemia, unspecified: Secondary | ICD-10-CM | POA: Diagnosis not present

## 2022-12-15 DIAGNOSIS — E1169 Type 2 diabetes mellitus with other specified complication: Secondary | ICD-10-CM | POA: Diagnosis not present

## 2022-12-15 DIAGNOSIS — I10 Essential (primary) hypertension: Secondary | ICD-10-CM | POA: Diagnosis not present

## 2022-12-23 DIAGNOSIS — M25511 Pain in right shoulder: Secondary | ICD-10-CM | POA: Diagnosis not present

## 2022-12-23 DIAGNOSIS — M25512 Pain in left shoulder: Secondary | ICD-10-CM | POA: Diagnosis not present

## 2022-12-23 DIAGNOSIS — M25551 Pain in right hip: Secondary | ICD-10-CM | POA: Diagnosis not present

## 2022-12-23 DIAGNOSIS — M25552 Pain in left hip: Secondary | ICD-10-CM | POA: Diagnosis not present

## 2023-01-05 DIAGNOSIS — M25552 Pain in left hip: Secondary | ICD-10-CM | POA: Diagnosis not present

## 2023-01-06 DIAGNOSIS — M25511 Pain in right shoulder: Secondary | ICD-10-CM | POA: Diagnosis not present

## 2023-01-06 DIAGNOSIS — M25512 Pain in left shoulder: Secondary | ICD-10-CM | POA: Diagnosis not present

## 2023-01-20 DIAGNOSIS — M79643 Pain in unspecified hand: Secondary | ICD-10-CM | POA: Diagnosis not present

## 2023-01-20 DIAGNOSIS — M791 Myalgia, unspecified site: Secondary | ICD-10-CM | POA: Diagnosis not present

## 2023-01-20 DIAGNOSIS — M0609 Rheumatoid arthritis without rheumatoid factor, multiple sites: Secondary | ICD-10-CM | POA: Diagnosis not present

## 2023-01-20 DIAGNOSIS — L4050 Arthropathic psoriasis, unspecified: Secondary | ICD-10-CM | POA: Diagnosis not present

## 2023-02-08 DIAGNOSIS — Z79899 Other long term (current) drug therapy: Secondary | ICD-10-CM | POA: Diagnosis not present

## 2023-02-08 DIAGNOSIS — H35033 Hypertensive retinopathy, bilateral: Secondary | ICD-10-CM | POA: Diagnosis not present

## 2023-02-08 DIAGNOSIS — H524 Presbyopia: Secondary | ICD-10-CM | POA: Diagnosis not present

## 2023-02-08 DIAGNOSIS — E119 Type 2 diabetes mellitus without complications: Secondary | ICD-10-CM | POA: Diagnosis not present

## 2023-02-08 DIAGNOSIS — H5213 Myopia, bilateral: Secondary | ICD-10-CM | POA: Diagnosis not present

## 2023-02-15 DIAGNOSIS — M25511 Pain in right shoulder: Secondary | ICD-10-CM | POA: Diagnosis not present

## 2023-02-15 DIAGNOSIS — M25552 Pain in left hip: Secondary | ICD-10-CM | POA: Diagnosis not present

## 2023-02-15 DIAGNOSIS — M25512 Pain in left shoulder: Secondary | ICD-10-CM | POA: Diagnosis not present

## 2023-04-07 DIAGNOSIS — M791 Myalgia, unspecified site: Secondary | ICD-10-CM | POA: Diagnosis not present

## 2023-04-07 DIAGNOSIS — L4050 Arthropathic psoriasis, unspecified: Secondary | ICD-10-CM | POA: Diagnosis not present

## 2023-04-07 DIAGNOSIS — L409 Psoriasis, unspecified: Secondary | ICD-10-CM | POA: Diagnosis not present

## 2023-04-07 DIAGNOSIS — M0609 Rheumatoid arthritis without rheumatoid factor, multiple sites: Secondary | ICD-10-CM | POA: Diagnosis not present

## 2023-04-07 DIAGNOSIS — M79643 Pain in unspecified hand: Secondary | ICD-10-CM | POA: Diagnosis not present

## 2023-05-05 DIAGNOSIS — R202 Paresthesia of skin: Secondary | ICD-10-CM | POA: Diagnosis not present

## 2023-05-12 DIAGNOSIS — E119 Type 2 diabetes mellitus without complications: Secondary | ICD-10-CM | POA: Diagnosis not present

## 2023-05-12 DIAGNOSIS — H35033 Hypertensive retinopathy, bilateral: Secondary | ICD-10-CM | POA: Diagnosis not present

## 2023-05-17 DIAGNOSIS — N959 Unspecified menopausal and perimenopausal disorder: Secondary | ICD-10-CM | POA: Diagnosis not present

## 2023-06-02 DIAGNOSIS — G5603 Carpal tunnel syndrome, bilateral upper limbs: Secondary | ICD-10-CM | POA: Diagnosis not present

## 2023-06-02 DIAGNOSIS — G5621 Lesion of ulnar nerve, right upper limb: Secondary | ICD-10-CM | POA: Diagnosis not present

## 2023-06-07 DIAGNOSIS — E7800 Pure hypercholesterolemia, unspecified: Secondary | ICD-10-CM | POA: Diagnosis not present

## 2023-06-07 DIAGNOSIS — M797 Fibromyalgia: Secondary | ICD-10-CM | POA: Diagnosis not present

## 2023-06-07 DIAGNOSIS — I10 Essential (primary) hypertension: Secondary | ICD-10-CM | POA: Diagnosis not present

## 2023-06-07 DIAGNOSIS — E1169 Type 2 diabetes mellitus with other specified complication: Secondary | ICD-10-CM | POA: Diagnosis not present

## 2023-07-06 DIAGNOSIS — M5410 Radiculopathy, site unspecified: Secondary | ICD-10-CM | POA: Diagnosis not present

## 2023-07-06 DIAGNOSIS — M25552 Pain in left hip: Secondary | ICD-10-CM | POA: Diagnosis not present

## 2023-07-06 DIAGNOSIS — M48062 Spinal stenosis, lumbar region with neurogenic claudication: Secondary | ICD-10-CM | POA: Diagnosis not present

## 2023-07-06 DIAGNOSIS — M25551 Pain in right hip: Secondary | ICD-10-CM | POA: Diagnosis not present

## 2023-07-07 DIAGNOSIS — M79643 Pain in unspecified hand: Secondary | ICD-10-CM | POA: Diagnosis not present

## 2023-07-07 DIAGNOSIS — Z79899 Other long term (current) drug therapy: Secondary | ICD-10-CM | POA: Diagnosis not present

## 2023-07-07 DIAGNOSIS — M791 Myalgia, unspecified site: Secondary | ICD-10-CM | POA: Diagnosis not present

## 2023-07-07 DIAGNOSIS — M0609 Rheumatoid arthritis without rheumatoid factor, multiple sites: Secondary | ICD-10-CM | POA: Diagnosis not present

## 2023-07-07 DIAGNOSIS — L4050 Arthropathic psoriasis, unspecified: Secondary | ICD-10-CM | POA: Diagnosis not present

## 2023-07-11 DIAGNOSIS — L308 Other specified dermatitis: Secondary | ICD-10-CM | POA: Diagnosis not present

## 2023-07-11 DIAGNOSIS — L550 Sunburn of first degree: Secondary | ICD-10-CM | POA: Diagnosis not present

## 2023-07-14 DIAGNOSIS — M25552 Pain in left hip: Secondary | ICD-10-CM | POA: Diagnosis not present

## 2023-07-14 DIAGNOSIS — G5603 Carpal tunnel syndrome, bilateral upper limbs: Secondary | ICD-10-CM | POA: Diagnosis not present

## 2023-07-14 DIAGNOSIS — M25541 Pain in joints of right hand: Secondary | ICD-10-CM | POA: Diagnosis not present

## 2023-07-14 DIAGNOSIS — M25542 Pain in joints of left hand: Secondary | ICD-10-CM | POA: Diagnosis not present

## 2023-07-14 DIAGNOSIS — L409 Psoriasis, unspecified: Secondary | ICD-10-CM | POA: Diagnosis not present

## 2023-07-14 DIAGNOSIS — M545 Low back pain, unspecified: Secondary | ICD-10-CM | POA: Diagnosis not present

## 2023-07-22 DIAGNOSIS — G5603 Carpal tunnel syndrome, bilateral upper limbs: Secondary | ICD-10-CM | POA: Diagnosis not present

## 2023-07-27 DIAGNOSIS — M25552 Pain in left hip: Secondary | ICD-10-CM | POA: Diagnosis not present

## 2023-08-05 DIAGNOSIS — M25552 Pain in left hip: Secondary | ICD-10-CM | POA: Diagnosis not present

## 2023-08-11 DIAGNOSIS — M25552 Pain in left hip: Secondary | ICD-10-CM | POA: Diagnosis not present

## 2023-08-17 DIAGNOSIS — M7062 Trochanteric bursitis, left hip: Secondary | ICD-10-CM | POA: Diagnosis not present

## 2023-08-18 DIAGNOSIS — Z13820 Encounter for screening for osteoporosis: Secondary | ICD-10-CM | POA: Diagnosis not present

## 2023-08-31 NOTE — Progress Notes (Signed)
Cardiology Office Note:    Date:  09/06/2023   ID:  Andrea Rhodes, DOB 1971/08/29, MRN [id number]  PCP:  Clayborn Heron, MD  Cardiologist:  Little Ishikawa, MD  Electrophysiologist:  None   Referring MD: Clayborn Heron, MD   Chief Complaint: follow-up of palpitations  History of Present Illness:    Andrea Rhodes is a 52 y.o. female with a history of atypical chest pain with normal coronaries on coronary CTA in 05/2021, PVCs, hypertension, hyperlipidemia, type 2 diabetes mellitus, asthma, GERD, IBS, fibromyalgia, and psoriatic arthritis who is followed by Dr. Bjorn Pippin and presents today for routine follow-up.   Patient was referred to Dr. Bjorn Pippin in 05/2021 after not being seen by Cardiology for several years for further evaluation of palpitations and chest pain. Chest pain sounded atypical. Zio monitor, Echo, and coronary CTA were ordered for further evaluation. Monitor showed predominant sinus rhythm with rare PACs/ PVCs but no significant arrhythmias. Echo showed LVEF of 70-75% with normal wall motion and grade 1 diastolic dysfunction. Coronary CTA showed a coronary calcium score of 0 with no evidence of CAD.   Patient presents today for follow-up. She describes an episode where her "heart hurt" in 6/22024 when it was really hot outside and she was trying to load her car. She also describes palpitations at this time and looked on her smart watch and her heart rate was in the 120s to 130s. She went inside and sat down and tachycardia and chest discomfort gradually went away after about 10-20 minutes. She had no recurrent symptoms until about 2 weeks ago when the same thing happened when she was bringing her trash cans back inside. She has a difficult time describing the chest pain and states her heart just hurt; however, the more we talk about this the more it sounds like the sensation she was feeling was just strong palpitations. She states these episodes were  exactly what she had when she initially saw Dr. Bjorn Pippin in 05/2021. She denies any other chest pain. She has had some congestion and states her lungs feel "wet." She also describes some post-nasal drainage into her chest and has been clearing her throat often. However, she denies any significant shortness of breath. No orthopnea or PND. She has some occasional lower extremity edema due to her psoriatic arthritis but overall stable. She notes occasional fluttering sensation that sounds like PACs/ PVCs and some dizziness with quick positions (not related to palpitations) but no syncope. She states her biggest issues right now are due to her psoriatic arthritis.   EKGs/Labs/Other Studies Reviewed:    The following studies were reviewed:  Monitor 05/30/2021 to 06/02/2021: Patient had a min HR of 58 bpm, max HR of 130 bpm, and avg HR of 84 bpm. Predominant underlying rhythm was Sinus Rhythm. Isolated SVEs were rare (<1.0%), and no SVE Couplets or SVE Triplets were present. Isolated VEs were rare (<1.0%), VE Couplets were rare (<1.0%), and no VE Triplets were present. Patient triggered events correspond to sinus rhythm   No signficant arrhythmias. _______________  Echocardiogram 06/24/2021: Impressions: 1. Left ventricular ejection fraction, by estimation, is 70 to 75%. The  left ventricle has hyperdynamic function. The left ventricle has no  regional wall motion abnormalities. Left ventricular diastolic parameters  are consistent with Grade I diastolic  dysfunction (impaired relaxation).   2. Right ventricular systolic function is normal. The right ventricular  size is normal. Tricuspid regurgitation signal is inadequate for assessing  PA pressure.   3.  The mitral valve is grossly normal. No evidence of mitral valve  regurgitation.   4. The aortic valve is tricuspid. Aortic valve regurgitation is not  visualized.  _______________  Coronary CTA 06/24/2021: Impressions: 1. Coronary calcium score of  0. This was 1st percentile for age, sex, and race matched control. 2. Normal coronary origin with right dominance. 3. CAD-RADS 0. No evidence of CAD (0%). Consider non-atherosclerotic causes of chest pain.  EKG:  EKG ordered today.   EKG Interpretation Date/Time:  Tuesday September 06 2023 11:23:05 EDT Ventricular Rate:  67 PR Interval:  158 QRS Duration:  82 QT Interval:  404 QTC Calculation: 426 R Axis:   23  Text Interpretation: Normal sinus rhythm Confirmed by Marjie Skiff [phone]) on 09/06/2023 11:36:01 AM    Recent Labs: No results found for requested labs within last 365 days.  Recent Lipid Panel No results found for: "CHOL", "TRIG", "HDL", "CHOLHDL", "VLDL", "LDLCALC", "LDLDIRECT"  Physical Exam:    Vital Signs: BP 122/82   Pulse 67   Ht 5\' 3"  (1.6 m)   Wt 189 lb 6.4 oz (85.9 kg)   SpO2 99%   BMI 33.55 kg/m     Wt Readings from Last 3 Encounters:  09/06/23 189 lb 6.4 oz (85.9 kg)  03/31/22 198 lb (89.8 kg)  02/12/22 198 lb 8 oz (90 kg)     General: 52 y.o. African-American female in no acute distress. HEENT: Normocephalic and atraumatic. Sclera clear. Neck: Supple. No carotid bruits. No JVD. Heart: RRR. Distinct S1 and S2. No murmurs, gallops, or rubs.  Lungs: No increased work of breathing. Clear to ausculation bilaterally. No wheezes, rhonchi, or rales. Extremities: No lower extremity edema.    Skin: Warm and dry. Neuro: Alert and oriented x3. No focal deficits. Psych: Normal affect. Responds appropriately.   Assessment:    1. Atypical chest pain   2. Palpitations   3. Hypertension, unspecified type   4. Hyperlipidemia, unspecified hyperlipidemia type   5. Type 2 diabetes mellitus without complication, without long-term current use of insulin (HCC)   6. Psoriatic arthritis (HCC)     Plan:    Atypical Chest Pain Patient has a history of atypical chest pain associated with palpitations. Coronary CTA in 05/2021 showed a coronary calcium score  of 0 with no evidence of CAD. Echo at that time showed normal LV function.  - She describes 2 episodes over the last 3 months of atypical chest discomfort that sounds like strong palpitations. She states her heart rates was in the 120s to 130s during both these episodes. She states these are identical to the symptoms she was having in 05/2021. No other chest pain. - EKG today shows normal sinus rhythm with no acute ischemic changes. - Chest pain sounds atypical and secondary to palpitations. No additional ischemic evaluation is necessary at this time given she had normal coronaries on coronary CTA in 05/2021 when she was having the same symptoms.  Palpitations Patient has a history of palpitations. Monitor in 05/2021 showed predominant sinus rhythm with rare PACs/ PVCs but no significant arrhythmias.  - She describes occasional fluttering sensation that sounds like premature beats as well as 2 longer episodes of palpitations over the last 3 months associated with some chest discomfort as described above. - EKG today shows normal sinus rhythm with no ectopy.  - Discussed staying well hydrated and limiting caffeine.  - Longer episodes of palpitations occur rarely so I think it is unlikely that we would catch them on  a monitor. Recommended the [facility] device. I think we can hold off on a beta-blocker right now.   Hypertension BP well controlled.  - Continue Lisinopril-HCTZ 20-25mg  daily. - Primarily managed by PCP.   Hyperlipidemia Lipid panel in 05/2023: Total Cholesterol 165, Triglycerides 154, HDL 66, LDL 74. - Continue Pravastatin 20mg  daily. - Managed by PCP.  Type 2 Diabetes Mellitus Hemoglobin A1c 6.4% in 05/2023. - On Rybelus. - Managed by PCP.  Psoriatic Arthritis - Managed by Rheumatology.  Disposition: Follow up in 1 year.    Medication Adjustments/Labs and Tests Ordered: Current medicines are reviewed at length with the patient today.  Concerns regarding medicines are  outlined above.  Orders Placed This Encounter  Procedures   EKG 12-Lead   EKG 12-Lead   No orders of the defined types were placed in this encounter.   Patient Instructions  Medication Instructions:  The current medical regimen is effective;  continue present plan and medications as directed. Please refer to the Current Medication list given to you today.  *If you need a refill on your cardiac medications before your next appointment, please call your pharmacy*  Lab Work: NONE If you have labs (blood work) drawn today and your tests are completely normal, you will receive your results only by: MyChart Message (if you have MyChart) OR A paper copy in the mail If you have any lab test that is abnormal or we need to change your treatment, we will call you to review the results.  Testing/Procedures: NONE  Follow-Up: At [facility], you and your health needs are our priority.  As part of our continuing mission to provide you with exceptional heart care, we have created designated Provider Care Teams.  These Care Teams include your primary Cardiologist (physician) and Advanced Practice Providers (APPs -  Physician Assistants and Nurse Practitioners) who all work together to provide you with the care you need, when you need it.  Your next appointment:   12 month(s)  Provider:   Little Ishikawa, MD     Other Instructions [address]   Signed, Corrin Parker, PA-C  09/06/2023 12:31 PM    [facility]

## 2023-09-06 ENCOUNTER — Ambulatory Visit: Payer: BC Managed Care – PPO | Attending: Student | Admitting: Student

## 2023-09-06 ENCOUNTER — Encounter: Payer: Self-pay | Admitting: Student

## 2023-09-06 VITALS — BP 122/82 | HR 67 | Ht 63.0 in | Wt 189.4 lb

## 2023-09-06 DIAGNOSIS — R002 Palpitations: Secondary | ICD-10-CM

## 2023-09-06 DIAGNOSIS — E119 Type 2 diabetes mellitus without complications: Secondary | ICD-10-CM

## 2023-09-06 DIAGNOSIS — E785 Hyperlipidemia, unspecified: Secondary | ICD-10-CM | POA: Diagnosis not present

## 2023-09-06 DIAGNOSIS — R0789 Other chest pain: Secondary | ICD-10-CM | POA: Diagnosis not present

## 2023-09-06 DIAGNOSIS — I10 Essential (primary) hypertension: Secondary | ICD-10-CM

## 2023-09-06 DIAGNOSIS — L4050 Arthropathic psoriasis, unspecified: Secondary | ICD-10-CM

## 2023-09-06 NOTE — Patient Instructions (Signed)
Medication Instructions:  The current medical regimen is effective;  continue present plan and medications as directed. Please refer to the Current Medication list given to you today.  *If you need a refill on your cardiac medications before your next appointment, please call your pharmacy*  Lab Work: NONE If you have labs (blood work) drawn today and your tests are completely normal, you will receive your results only by: MyChart Message (if you have MyChart) OR A paper copy in the mail If you have any lab test that is abnormal or we need to change your treatment, we will call you to review the results.  Testing/Procedures: NONE  Follow-Up: At [facility], you and your health needs are our priority.  As part of our continuing mission to provide you with exceptional heart care, we have created designated Provider Care Teams.  These Care Teams include your primary Cardiologist (physician) and Advanced Practice Providers (APPs -  Physician Assistants and Nurse Practitioners) who all work together to provide you with the care you need, when you need it.  Your next appointment:   12 month(s)  Provider:   Little Ishikawa, MD     Other Instructions [facility]

## 2023-09-07 ENCOUNTER — Other Ambulatory Visit: Payer: Self-pay | Admitting: Obstetrics and Gynecology

## 2023-09-07 DIAGNOSIS — Z1231 Encounter for screening mammogram for malignant neoplasm of breast: Secondary | ICD-10-CM

## 2023-09-09 NOTE — Progress Notes (Signed)
Order(s) created erroneously. Erroneous order ID: [id number]  Order moved by: CHART CORRECTION ANALYST, FIFTEEN  Order move date/time: 09/09/2023 12:47 PM  Source Patient: [id number]  Source Contact: 09/06/2023  Destination Patient: [id number]  Destination Contact: 03/13/2013

## 2023-09-11 ENCOUNTER — Other Ambulatory Visit: Payer: Self-pay | Admitting: Physician Assistant

## 2023-09-12 NOTE — Telephone Encounter (Signed)
Contacted pt & LVM to return call.

## 2023-09-28 DIAGNOSIS — R410 Disorientation, unspecified: Secondary | ICD-10-CM | POA: Diagnosis not present

## 2023-10-12 ENCOUNTER — Institutional Professional Consult (permissible substitution): Payer: BC Managed Care – PPO | Admitting: Pulmonary Disease

## 2023-10-20 DIAGNOSIS — Z79899 Other long term (current) drug therapy: Secondary | ICD-10-CM | POA: Diagnosis not present

## 2023-10-20 DIAGNOSIS — L4050 Arthropathic psoriasis, unspecified: Secondary | ICD-10-CM | POA: Diagnosis not present

## 2023-10-25 DIAGNOSIS — Z79899 Other long term (current) drug therapy: Secondary | ICD-10-CM | POA: Diagnosis not present

## 2023-10-25 DIAGNOSIS — L4050 Arthropathic psoriasis, unspecified: Secondary | ICD-10-CM | POA: Diagnosis not present

## 2023-11-06 ENCOUNTER — Emergency Department (HOSPITAL_BASED_OUTPATIENT_CLINIC_OR_DEPARTMENT_OTHER)
Admission: EM | Admit: 2023-11-06 | Discharge: 2023-11-06 | Disposition: A | Discharge status: Home / Self Care | Payer: BC Managed Care – PPO | Attending: Emergency Medicine | Admitting: Emergency Medicine

## 2023-11-06 ENCOUNTER — Encounter (HOSPITAL_BASED_OUTPATIENT_CLINIC_OR_DEPARTMENT_OTHER): Payer: Self-pay | Admitting: Emergency Medicine

## 2023-11-06 ENCOUNTER — Other Ambulatory Visit: Payer: Self-pay

## 2023-11-06 DIAGNOSIS — M25511 Pain in right shoulder: Secondary | ICD-10-CM | POA: Insufficient documentation

## 2023-11-06 DIAGNOSIS — M79601 Pain in right arm: Secondary | ICD-10-CM | POA: Diagnosis not present

## 2023-11-06 DIAGNOSIS — M792 Neuralgia and neuritis, unspecified: Secondary | ICD-10-CM

## 2023-11-06 HISTORY — DX: Psoriasis, unspecified: L40.9

## 2023-11-06 MED ORDER — LIDOCAINE 5 % EX PTCH
2.0000 | MEDICATED_PATCH | CUTANEOUS | Status: DC
  Administered 2023-11-06: 2 via TRANSDERMAL
  Filled 2023-11-06: qty 2

## 2023-11-06 MED ORDER — DEXAMETHASONE SODIUM PHOSPHATE 4 MG/ML IJ SOLN
4.0000 mg | Freq: Once | INTRAMUSCULAR | Status: AC
  Administered 2023-11-06: 4 mg via INTRAMUSCULAR
  Filled 2023-11-06: qty 1

## 2023-11-06 MED ORDER — LIDOCAINE 5 % EX PTCH: 1.0000 | MEDICATED_PATCH | CUTANEOUS | 0 refills | Status: DC

## 2023-11-06 NOTE — ED Provider Notes (Signed)
[place] EMERGENCY DEPARTMENT AT [facility] [place] Provider Note   CSN: [id number] Arrival date & time: 11/06/23  0536     History  No chief complaint on file.   Andrea Rhodes is a 52 y.o. female.  The history is provided by the patient.  Shoulder Pain Location:  Shoulder Pain details:    Quality:  Shooting   Severity:  Severe   Onset quality:  Sudden   Timing:  Constant   Progression:  Unchanged Foreign body present:  No foreign bodies Relieved by:  Nothing Worsened by:  Movement Ineffective treatments: voltaren. Risk factors: no concern for non-accidental trauma   Patient with Psoriatic arthritis and carpal tunnel and radial tunnel presents with right shoulder pain that is radiating.  No CP no SOB.  Pain is worse with movement and in certain position     Home Medications Prior to Admission medications   Medication Sig Start Date End Date Taking? Authorizing Provider  Adalimumab (HUMIRA [place]) Inject into the skin. Per poatient taking every other week    [provider]  albuterol (PROVENTIL HFA;VENTOLIN HFA) 108 (90 BASE) MCG/ACT inhaler Inhale 2 puffs into the lungs every 4 (four) hours as needed for wheezing or shortness of breath.    [provider]  betamethasone valerate (VALISONE) 0.1 % cream Apply topically as needed. 07/08/23   [provider]  Cholecalciferol (VITAMIN D) 2000 UNITS CAPS Take 1 capsule by mouth daily.    [provider]  diclofenac (VOLTAREN) 50 MG EC tablet Take 50 mg by mouth 2 (two) times daily. 07/14/23   [provider]  estradiol (VIVELLE-DOT) 0.1 MG/24HR patch Place 1 patch onto the skin 2 (two) times a week. 07/27/23   [provider]  folic acid (FOLVITE) 1 MG tablet Take 1 mg by mouth daily. Patient takes 2 tablets daily    [provider]  leflunomide (ARAVA) 20 MG tablet Take 20 mg by mouth daily. 06/15/23   [provider]  lisinopril-hydrochlorothiazide  (ZESTORETIC) 20-25 MG tablet Take 1 tablet by mouth daily. 05/04/21   [provider]  omeprazole (PRILOSEC) 40 MG capsule TAKE 1 CAPSULE DAILY 09/12/23   Unk Lightning, PA  pravastatin (PRAVACHOL) 20 MG tablet Take 20 mg by mouth daily.    [provider]  predniSONE (DELTASONE) 5 MG tablet Take 5 mg by mouth daily as needed. 05/04/21   [provider]  predniSONE (DELTASONE) 5 MG tablet Take 5 mg by mouth daily with breakfast.    [provider]  RYBELSUS 7 MG TABS Take 1 tablet by mouth every morning. 08/18/23   [provider]      Allergies    Meperidine hcl, Nystatin, and Other    Review of Systems   Review of Systems  Constitutional:  Negative for diaphoresis.  Respiratory:  Negative for shortness of breath, wheezing and stridor.   Cardiovascular:  Negative for chest pain.  Gastrointestinal:  Negative for nausea and vomiting.  Musculoskeletal:  Positive for arthralgias.  All other systems reviewed and are negative.   Physical Exam Updated Vital Signs BP (!) 140/104 (BP Location: Left Arm)   Pulse 82   Temp 98.4 F (36.9 C) (Oral)   Resp 18   Ht 5\' 3"  (1.6 m)   Wt 85.3 kg   SpO2 96%   BMI 33.30 kg/m  Physical Exam Vitals and nursing note reviewed.  Constitutional:      General: She is not in acute distress.  Appearance: Normal appearance. She is well-developed.  HENT:     Head: Normocephalic and atraumatic.     Nose: Nose normal.  Eyes:     Pupils: Pupils are equal, round, and reactive to light.  Cardiovascular:     Rate and Rhythm: Normal rate and regular rhythm.     Pulses: Normal pulses.     Heart sounds: Normal heart sounds.  Pulmonary:     Effort: No respiratory distress.     Breath sounds: Normal breath sounds.  Abdominal:     General: Bowel sounds are normal. There is no distension.     Palpations: Abdomen is soft.     Tenderness: There is no abdominal tenderness. There is no guarding or rebound.   Genitourinary:    Vagina: No vaginal discharge.  Musculoskeletal:        General: Normal range of motion.     Right shoulder: No swelling, deformity, effusion, laceration or crepitus. Normal range of motion. Normal strength. Normal pulse.     Cervical back: Neck supple.     Comments: RUE negative Neers test, pain with motion.    Skin:    Findings: No erythema or rash.  Neurological:     Mental Status: She is alert and oriented to person, place, and time.     ED Results / Procedures / Treatments   Labs (all labs ordered are listed, but only abnormal results are displayed) Labs Reviewed - No data to display  EKG None  Radiology No results found.  Procedures Procedures    Medications Ordered in ED Medications  lidocaine (LIDODERM) 5 % 2 patch (2 patches Transdermal Patch Applied 11/06/23 0643)  dexamethasone (DECADRON) injection 4 mg (4 mg Intramuscular Given 11/06/23 9563)    ED Course/ Medical Decision Making/ A&P                                 Medical Decision Making Patient with R shoulder pain with motion, has psoriatic arthritis just came off steroids   Amount and/or Complexity of Data Reviewed Independent Historian: spouse    Details: See above  External Data Reviewed: notes.    Details: Previous notes reviewed   Risk Prescription drug management. Risk Details: Patient wants a steroids shot as NSAIDs are not helping the pain.  I have advised close follow up with her spine and shoulder specialist to discuss PT and injections and 3d imaging of C spine.  Stable for discharge.  Strict return     Final Clinical Impression(s) / ED Diagnoses Final diagnoses:  None   Return for intractable cough, coughing up blood, fevers > 100.4 unrelieved by medication, shortness of breath, intractable vomiting, chest pain, shortness of breath, weakness, numbness, changes in speech, facial asymmetry, abdominal pain, passing out, Inability to tolerate liquids or food, cough,  altered mental status or any concerns. No signs of systemic illness or infection. The patient is nontoxic-appearing on exam and vital signs are within normal limits.  I have reviewed the triage vital signs and the nursing notes. Pertinent labs & imaging results that were available during my care of the patient were reviewed by me and considered in my medical decision making (see chart for details). After history, exam, and medical workup I feel the patient has been appropriately medically screened and is safe for discharge home. Pertinent diagnoses were discussed with the patient. Patient was given return precautions.  Rx / DC Orders ED Discharge  Orders     None         Thadeus Gandolfi, MD 11/06/23 [phone]

## 2023-11-06 NOTE — ED Triage Notes (Addendum)
Pt reports R upper back/shoulder pain that woke her from sleep at 0130. Pt states she has had shoulder pain in the past but this does not feel the same. She states she feels it is nerve pain, worse with moving her neck, and sometimes she has numbness in her R hand. She describes the pain as sharp and 10/10.

## 2023-11-10 DIAGNOSIS — M25512 Pain in left shoulder: Secondary | ICD-10-CM | POA: Diagnosis not present

## 2023-11-10 DIAGNOSIS — M25511 Pain in right shoulder: Secondary | ICD-10-CM | POA: Diagnosis not present

## 2023-11-10 DIAGNOSIS — M542 Cervicalgia: Secondary | ICD-10-CM | POA: Diagnosis not present

## 2023-11-21 DIAGNOSIS — M5412 Radiculopathy, cervical region: Secondary | ICD-10-CM | POA: Diagnosis not present

## 2023-11-21 DIAGNOSIS — M542 Cervicalgia: Secondary | ICD-10-CM | POA: Diagnosis not present

## 2023-11-30 DIAGNOSIS — M542 Cervicalgia: Secondary | ICD-10-CM | POA: Diagnosis not present

## 2023-11-30 DIAGNOSIS — M5412 Radiculopathy, cervical region: Secondary | ICD-10-CM | POA: Diagnosis not present

## 2023-12-12 DIAGNOSIS — Z01419 Encounter for gynecological examination (general) (routine) without abnormal findings: Secondary | ICD-10-CM | POA: Diagnosis not present

## 2023-12-12 DIAGNOSIS — Z6833 Body mass index (BMI) 33.0-33.9, adult: Secondary | ICD-10-CM | POA: Diagnosis not present

## 2023-12-13 ENCOUNTER — Ambulatory Visit
Admission: RE | Admit: 2023-12-13 | Discharge: 2023-12-13 | Disposition: A | Discharge status: Home / Self Care | Payer: BC Managed Care – PPO | Source: Ambulatory Visit | Attending: Obstetrics and Gynecology | Admitting: Obstetrics and Gynecology

## 2023-12-13 DIAGNOSIS — Z1231 Encounter for screening mammogram for malignant neoplasm of breast: Secondary | ICD-10-CM

## 2023-12-16 ENCOUNTER — Ambulatory Visit: Payer: BC Managed Care – PPO | Admitting: Physician Assistant

## 2023-12-16 ENCOUNTER — Encounter: Payer: Self-pay | Admitting: Physician Assistant

## 2023-12-16 VITALS — BP 122/80 | HR 88 | Ht 63.0 in | Wt 185.4 lb

## 2023-12-16 DIAGNOSIS — E7800 Pure hypercholesterolemia, unspecified: Secondary | ICD-10-CM | POA: Diagnosis not present

## 2023-12-16 DIAGNOSIS — Z8719 Personal history of other diseases of the digestive system: Secondary | ICD-10-CM

## 2023-12-16 DIAGNOSIS — K219 Gastro-esophageal reflux disease without esophagitis: Secondary | ICD-10-CM | POA: Diagnosis not present

## 2023-12-16 DIAGNOSIS — E1169 Type 2 diabetes mellitus with other specified complication: Secondary | ICD-10-CM | POA: Diagnosis not present

## 2023-12-16 DIAGNOSIS — I10 Essential (primary) hypertension: Secondary | ICD-10-CM | POA: Diagnosis not present

## 2023-12-16 DIAGNOSIS — M797 Fibromyalgia: Secondary | ICD-10-CM | POA: Diagnosis not present

## 2023-12-16 DIAGNOSIS — Z6833 Body mass index (BMI) 33.0-33.9, adult: Secondary | ICD-10-CM | POA: Diagnosis not present

## 2023-12-16 MED ORDER — OMEPRAZOLE 40 MG PO CPDR: 40.0000 mg | DELAYED_RELEASE_CAPSULE | Freq: Every day | ORAL | 3 refills | Status: DC

## 2023-12-16 MED ORDER — FAMOTIDINE 20 MG PO TABS: 20.0000 mg | ORAL_TABLET | Freq: Every day | ORAL | 3 refills | Status: DC

## 2023-12-16 NOTE — Progress Notes (Signed)
Chief Complaint: Follow-up GERD  HPI:    Andrea Rhodes is a 52 year old African-American female with a past medical history as listed below including reflux and fatty liver disease, previously known to Dr. Christella Hartigan, who was referred to me by Rankins, Fanny Dance, MD for follow-up of reflux.    07/01/2020 colonoscopy for screening with normal colon and a small amount of deflated external hemorrhoid tissue. Repeat recommended in 10 years.     02/12/2022 patient seen in clinic at that time discussed that she has always had some reflux symptoms and typically used over-the-counter Zegerid.  That time had an increase in cough initially thought a side effect from her RA medicine but since Omeprazole did help.  Recommend the patient have an EGD.  Increase Meprazole to 40 mg daily.    03/31/2022 EGD Dr. Christella Hartigan with mild nonspecific distal gastritis and a few small typical appearing fundic gland polyps.  At that time noted to have nonerosive GERD.  Pathology showed mild chronic gastritis.  She was continued on Meprazole 40 mg daily.  Discussed taking a single Pepcid or famotidine 20 mg in the afternoon if had breakthrough symptoms.    Today, patient presents to clinic and tells me she has had a rough year trying to figure out what can help her rheumatoid arthritis and psoriasis.  She has been through various Biologics and is currently on Humira.  As far as her reflux symptoms she tells me that she has been needing her Famotidine 20 mg more often especially if she knows she is getting eat a big meal or something spicy or heavy in the evenings.  Otherwise does well on her Omeprazole 40 mg daily.    Denies fever, chills or weight loss.  Past Medical History:  Diagnosis Date   ARTHRITIS    Arthritis    Asthma    ASTHMA, UNSPECIFIED    Carpal tunnel syndrome    CONSTIPATION    Diabetes mellitus without complication (HCC)    FATTY LIVER DISEASE    Fibromyalgia    GERD    HYPERTENSION    Irritable bowel  syndrome    NEPHROLITHIASIS, HX OF    OBESITY    Psoriasis     Past Surgical History:  Procedure Laterality Date   BLADDER SURGERY     laparoscopy 1984     VAGINAL HYSTERECTOMY      Current Outpatient Medications  Medication Sig Dispense Refill   Adalimumab (HUMIRA [place]) Inject into the skin. Per poatient taking every other week     albuterol (PROVENTIL HFA;VENTOLIN HFA) 108 (90 BASE) MCG/ACT inhaler Inhale 2 puffs into the lungs every 4 (four) hours as needed for wheezing or shortness of breath.     betamethasone valerate (VALISONE) 0.1 % cream Apply topically as needed.     Cholecalciferol (VITAMIN D) 2000 UNITS CAPS Take 1 capsule by mouth daily.     diclofenac (VOLTAREN) 50 MG EC tablet Take 50 mg by mouth 2 (two) times daily.     estradiol (VIVELLE-DOT) 0.1 MG/24HR patch Place 1 patch onto the skin 2 (two) times a week.     folic acid (FOLVITE) 1 MG tablet Take 1 mg by mouth daily. Patient takes 2 tablets daily     leflunomide (ARAVA) 20 MG tablet Take 20 mg by mouth daily.     lidocaine (LIDODERM) 5 % Place 1 patch onto the skin daily. Remove & Discard patch within 12 hours or as directed by MD 30 patch 0  lisinopril-hydrochlorothiazide (ZESTORETIC) 20-25 MG tablet Take 1 tablet by mouth daily.     omeprazole (PRILOSEC) 40 MG capsule TAKE 1 CAPSULE DAILY 90 capsule 3   pravastatin (PRAVACHOL) 20 MG tablet Take 20 mg by mouth daily.     predniSONE (DELTASONE) 5 MG tablet Take 5 mg by mouth daily as needed.     predniSONE (DELTASONE) 5 MG tablet Take 5 mg by mouth daily with breakfast.     RYBELSUS 7 MG TABS Take 1 tablet by mouth every morning.     No current facility-administered medications for this visit.    Allergies as of 12/16/2023 - Review Complete 11/06/2023  Allergen Reaction Noted   Meperidine hcl  01/07/2011   Nystatin  01/07/2011   Other  11/15/2014    Family History  Problem Relation Age of Onset   Diabetes Mother    Hypertension Mother    Lung cancer  Father        PTSD   Multiple sclerosis Maternal Aunt        no family hx   Glaucoma Maternal Grandmother    Breast cancer Cousin 77   Cancer Other        1st cousin, Breast CA, deceased at 44y   Colon cancer Neg Hx    Esophageal cancer Neg Hx    Rectal cancer Neg Hx    Stomach cancer Neg Hx     Social History   Socioeconomic History   Marital status: Married    Spouse name: Not on file   Number of children: Not on file   Years of education: Not on file   Highest education level: Not on file  Occupational History   Not on file  Tobacco Use   Smoking status: Never   Smokeless tobacco: Never  Vaping Use   Vaping status: Never Used  Substance and Sexual Activity   Alcohol use: No    Alcohol/week: 0.0 standard drinks of alcohol   Drug use: No   Sexual activity: Not on file  Other Topics Concern   Not on file  Social History Narrative   Caffeine 3 x week, 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     Social Drivers of Corporate investment banker Strain: Not on file  Food Insecurity: Not on file  Transportation Needs: Not on file  Physical Activity: Not on file  Stress: Not on file  Social Connections: Not on file  Intimate Partner Violence: Not on file    Review of Systems:    Constitutional: No weight loss, fever or chills Cardiovascular: No chest pain  Respiratory: No SOB  Gastrointestinal: See HPI and otherwise negative   Physical Exam:  Vital signs: BP 122/80 (BP Location: Left Arm, Patient Position: Sitting, Cuff Size: Normal)   Pulse 88   Ht 5\' 3"  (1.6 m)   Wt 185 lb 6 oz (84.1 kg)   SpO2 98%   BMI 32.84 kg/m    Constitutional:   Pleasant AA female appears to be in NAD, Well developed, Well nourished, alert and cooperative Respiratory: Respirations even and unlabored. Lungs clear to auscultation bilaterally.   No wheezes, crackles, or rhonchi.  Cardiovascular: Normal S1, S2. No MRG. Regular rate and rhythm. No peripheral  edema, cyanosis or pallor.  Gastrointestinal:  Soft, nondistended, nontender. No rebound or guarding. Normal bowel sounds. No appreciable masses or hepatomegaly. Rectal:  Not performed.  Psychiatric: Demonstrates good judgement and reason without abnormal affect or behaviors.  RELEVANT LABS  AND IMAGING: CBC    Component Value Date/Time   WBC 10.2 01/14/2014 0420   RBC 4.16 01/14/2014 0420   HGB 12.7 01/14/2014 0420   HCT 37.8 01/14/2014 0420   PLT 335 01/14/2014 0420   MCV 90.9 01/14/2014 0420   MCH 30.5 01/14/2014 0420   MCHC 33.6 01/14/2014 0420   RDW 12.0 01/14/2014 0420   LYMPHSABS 3.2 01/14/2014 0420   MONOABS 0.8 01/14/2014 0420   EOSABS 0.2 01/14/2014 0420   BASOSABS 0.0 01/14/2014 0420    CMP     Component Value Date/Time   NA 139 06/18/2021 1232   K 4.4 06/18/2021 1232   CL 99 06/18/2021 1232   CO2 24 06/18/2021 1232   GLUCOSE 79 06/18/2021 1232   GLUCOSE 103 (H) 01/14/2014 0420   BUN 8 06/18/2021 1232   CREATININE 0.68 06/18/2021 1232   CALCIUM 9.8 06/18/2021 1232   GFRNONAA >90 01/14/2014 0420   GFRAA >90 01/14/2014 0420    Assessment: 1.  GERD: EGD in April 2023 of mild chronic gastritis, seems to be doing well on Omeprazole 40 mg daily and the occasional Famotidine 20 mg as needed  Plan: 1.  Refilled Omeprazole 40 mg daily #90 with 3 refills sent to [facility] 2.  Prescribed Famotidine 20 mg daily as needed #90 with 3 refills sent to [facility] 3.  Patient to follow in clinic with [place] in 1 year for further refills or sooner if having problems.  Assigned to Dr. Leonides Schanz this afternoon in lieu of Dr. Larae Grooms absence.  Hyacinth Meeker, PA-C [facility] 12/16/2023, 3:05 PM  Cc: Clayborn Heron, MD

## 2023-12-16 NOTE — Progress Notes (Signed)
I agree with the assessment and plan as outlined by Ms. Lemmon. 

## 2023-12-16 NOTE — Patient Instructions (Signed)
We have sent the following medications to your pharmacy for you to pick up at your convenience:  Omeprazole, Pepcid.  Please follow up in 2 years.  _______________________________________________________  If your blood pressure at your visit was 140/90 or greater, please contact your primary care physician to follow up on this.  _______________________________________________________  If you are age 53 or older, your body mass index should be between 23-30. Your Body mass index is 32.84 kg/m. If this is out of the aforementioned range listed, please consider follow up with your Primary Care Provider.  If you are age 83 or younger, your body mass index should be between 19-25. Your Body mass index is 32.84 kg/m. If this is out of the aformentioned range listed, please consider follow up with your Primary Care Provider.   ________________________________________________________  The [facility] providers would like to encourage you to use [facility] to communicate with providers for non-urgent requests or questions.  Due to long hold times on the telephone, sending your provider a message by [facility] may be a faster and more efficient way to get a response.  Please allow 48 business hours for a response.  Please remember that this is for non-urgent requests.  _______________________________________________________

## 2023-12-29 DIAGNOSIS — M542 Cervicalgia: Secondary | ICD-10-CM | POA: Diagnosis not present

## 2023-12-29 DIAGNOSIS — M25512 Pain in left shoulder: Secondary | ICD-10-CM | POA: Diagnosis not present

## 2023-12-29 DIAGNOSIS — M25511 Pain in right shoulder: Secondary | ICD-10-CM | POA: Diagnosis not present

## 2024-01-23 DIAGNOSIS — L308 Other specified dermatitis: Secondary | ICD-10-CM | POA: Diagnosis not present

## 2024-01-26 DIAGNOSIS — M797 Fibromyalgia: Secondary | ICD-10-CM | POA: Diagnosis not present

## 2024-01-26 DIAGNOSIS — M79643 Pain in unspecified hand: Secondary | ICD-10-CM | POA: Diagnosis not present

## 2024-01-26 DIAGNOSIS — R5383 Other fatigue: Secondary | ICD-10-CM | POA: Diagnosis not present

## 2024-01-26 DIAGNOSIS — Z79899 Other long term (current) drug therapy: Secondary | ICD-10-CM | POA: Diagnosis not present

## 2024-01-26 DIAGNOSIS — L4050 Arthropathic psoriasis, unspecified: Secondary | ICD-10-CM | POA: Diagnosis not present

## 2024-01-31 DIAGNOSIS — R945 Abnormal results of liver function studies: Secondary | ICD-10-CM | POA: Diagnosis not present

## 2024-01-31 DIAGNOSIS — L4050 Arthropathic psoriasis, unspecified: Secondary | ICD-10-CM | POA: Diagnosis not present

## 2024-01-31 DIAGNOSIS — R1011 Right upper quadrant pain: Secondary | ICD-10-CM | POA: Diagnosis not present

## 2024-02-14 DIAGNOSIS — D23122 Other benign neoplasm of skin of left lower eyelid, including canthus: Secondary | ICD-10-CM | POA: Diagnosis not present

## 2024-02-14 DIAGNOSIS — H5213 Myopia, bilateral: Secondary | ICD-10-CM | POA: Diagnosis not present

## 2024-02-14 DIAGNOSIS — E119 Type 2 diabetes mellitus without complications: Secondary | ICD-10-CM | POA: Diagnosis not present

## 2024-02-14 DIAGNOSIS — H01021 Squamous blepharitis right upper eyelid: Secondary | ICD-10-CM | POA: Diagnosis not present

## 2024-02-14 DIAGNOSIS — H524 Presbyopia: Secondary | ICD-10-CM | POA: Diagnosis not present

## 2024-02-14 DIAGNOSIS — H01024 Squamous blepharitis left upper eyelid: Secondary | ICD-10-CM | POA: Diagnosis not present

## 2024-05-09 DIAGNOSIS — M5412 Radiculopathy, cervical region: Secondary | ICD-10-CM | POA: Diagnosis not present

## 2024-05-09 DIAGNOSIS — M25552 Pain in left hip: Secondary | ICD-10-CM | POA: Diagnosis not present

## 2024-05-09 DIAGNOSIS — M542 Cervicalgia: Secondary | ICD-10-CM | POA: Diagnosis not present

## 2024-05-10 DIAGNOSIS — Z79899 Other long term (current) drug therapy: Secondary | ICD-10-CM | POA: Diagnosis not present

## 2024-05-10 DIAGNOSIS — M79643 Pain in unspecified hand: Secondary | ICD-10-CM | POA: Diagnosis not present

## 2024-05-10 DIAGNOSIS — L4050 Arthropathic psoriasis, unspecified: Secondary | ICD-10-CM | POA: Diagnosis not present

## 2024-05-10 DIAGNOSIS — M797 Fibromyalgia: Secondary | ICD-10-CM | POA: Diagnosis not present

## 2024-05-10 DIAGNOSIS — R5383 Other fatigue: Secondary | ICD-10-CM | POA: Diagnosis not present

## 2024-06-01 DIAGNOSIS — M5412 Radiculopathy, cervical region: Secondary | ICD-10-CM | POA: Diagnosis not present

## 2024-06-01 DIAGNOSIS — R053 Chronic cough: Secondary | ICD-10-CM | POA: Diagnosis not present

## 2024-06-07 DIAGNOSIS — R748 Abnormal levels of other serum enzymes: Secondary | ICD-10-CM | POA: Diagnosis not present

## 2024-06-11 DIAGNOSIS — M898X1 Other specified disorders of bone, shoulder: Secondary | ICD-10-CM | POA: Diagnosis not present

## 2024-06-11 DIAGNOSIS — R200 Anesthesia of skin: Secondary | ICD-10-CM | POA: Diagnosis not present

## 2024-06-11 DIAGNOSIS — R748 Abnormal levels of other serum enzymes: Secondary | ICD-10-CM | POA: Diagnosis not present

## 2024-06-11 DIAGNOSIS — K219 Gastro-esophageal reflux disease without esophagitis: Secondary | ICD-10-CM | POA: Diagnosis not present

## 2024-06-11 DIAGNOSIS — J40 Bronchitis, not specified as acute or chronic: Secondary | ICD-10-CM | POA: Diagnosis not present

## 2024-06-11 DIAGNOSIS — R059 Cough, unspecified: Secondary | ICD-10-CM | POA: Diagnosis not present

## 2024-06-11 DIAGNOSIS — R053 Chronic cough: Secondary | ICD-10-CM | POA: Diagnosis not present

## 2024-06-11 DIAGNOSIS — L4050 Arthropathic psoriasis, unspecified: Secondary | ICD-10-CM | POA: Diagnosis not present

## 2024-06-11 DIAGNOSIS — J4541 Moderate persistent asthma with (acute) exacerbation: Secondary | ICD-10-CM | POA: Diagnosis not present

## 2024-06-13 DIAGNOSIS — R748 Abnormal levels of other serum enzymes: Secondary | ICD-10-CM | POA: Diagnosis not present

## 2024-06-13 DIAGNOSIS — K760 Fatty (change of) liver, not elsewhere classified: Secondary | ICD-10-CM | POA: Diagnosis not present

## 2024-06-19 DIAGNOSIS — R918 Other nonspecific abnormal finding of lung field: Secondary | ICD-10-CM | POA: Diagnosis not present

## 2024-06-19 DIAGNOSIS — I700 Atherosclerosis of aorta: Secondary | ICD-10-CM | POA: Diagnosis not present

## 2024-06-19 DIAGNOSIS — L4050 Arthropathic psoriasis, unspecified: Secondary | ICD-10-CM | POA: Diagnosis not present

## 2024-06-19 DIAGNOSIS — R053 Chronic cough: Secondary | ICD-10-CM | POA: Diagnosis not present

## 2024-06-26 DIAGNOSIS — E7800 Pure hypercholesterolemia, unspecified: Secondary | ICD-10-CM | POA: Diagnosis not present

## 2024-06-26 DIAGNOSIS — R053 Chronic cough: Secondary | ICD-10-CM | POA: Diagnosis not present

## 2024-06-26 DIAGNOSIS — I10 Essential (primary) hypertension: Secondary | ICD-10-CM | POA: Diagnosis not present

## 2024-06-26 DIAGNOSIS — Z6832 Body mass index (BMI) 32.0-32.9, adult: Secondary | ICD-10-CM | POA: Diagnosis not present

## 2024-06-26 DIAGNOSIS — L4050 Arthropathic psoriasis, unspecified: Secondary | ICD-10-CM | POA: Diagnosis not present

## 2024-06-26 DIAGNOSIS — E1169 Type 2 diabetes mellitus with other specified complication: Secondary | ICD-10-CM | POA: Diagnosis not present

## 2024-07-10 ENCOUNTER — Other Ambulatory Visit (INDEPENDENT_AMBULATORY_CARE_PROVIDER_SITE_OTHER)

## 2024-07-10 ENCOUNTER — Ambulatory Visit: Admitting: Internal Medicine

## 2024-07-10 ENCOUNTER — Encounter: Payer: Self-pay | Admitting: Internal Medicine

## 2024-07-10 VITALS — BP 140/90 | HR 88 | Ht 62.0 in | Wt 181.1 lb

## 2024-07-10 DIAGNOSIS — K760 Fatty (change of) liver, not elsewhere classified: Secondary | ICD-10-CM

## 2024-07-10 DIAGNOSIS — R160 Hepatomegaly, not elsewhere classified: Secondary | ICD-10-CM

## 2024-07-10 DIAGNOSIS — K219 Gastro-esophageal reflux disease without esophagitis: Secondary | ICD-10-CM

## 2024-07-10 DIAGNOSIS — R7989 Other specified abnormal findings of blood chemistry: Secondary | ICD-10-CM

## 2024-07-10 LAB — CBC WITH DIFFERENTIAL/PLATELET
Basophils Absolute: 0.1 K/uL (ref 0.0–0.1)
Basophils Relative: 0.8 % (ref 0.0–3.0)
Eosinophils Absolute: 0.3 K/uL (ref 0.0–0.7)
Eosinophils Relative: 3.7 % (ref 0.0–5.0)
HCT: 42.1 % (ref 36.0–46.0)
Hemoglobin: 14.2 g/dL (ref 12.0–15.0)
Lymphocytes Relative: 31.6 % (ref 12.0–46.0)
Lymphs Abs: 2.3 K/uL (ref 0.7–4.0)
MCHC: 33.7 g/dL (ref 30.0–36.0)
MCV: 91.9 fl (ref 78.0–100.0)
Monocytes Absolute: 0.6 K/uL (ref 0.1–1.0)
Monocytes Relative: 8.5 % (ref 3.0–12.0)
Neutro Abs: 4.1 K/uL (ref 1.4–7.7)
Neutrophils Relative %: 55.4 % (ref 43.0–77.0)
Platelets: 344 K/uL (ref 150.0–400.0)
RBC: 4.59 Mil/uL (ref 3.87–5.11)
RDW: 12.9 % (ref 11.5–15.5)
WBC: 7.4 K/uL (ref 4.0–10.5)

## 2024-07-10 LAB — HEPATIC FUNCTION PANEL
ALT: 40 U/L — ABNORMAL HIGH (ref 0–35)
AST: 24 U/L (ref 0–37)
Albumin: 4.4 g/dL (ref 3.5–5.2)
Alkaline Phosphatase: 47 U/L (ref 39–117)
Bilirubin, Direct: 0 mg/dL (ref 0.0–0.3)
Total Bilirubin: 0.4 mg/dL (ref 0.2–1.2)
Total Protein: 7.5 g/dL (ref 6.0–8.3)

## 2024-07-10 LAB — IBC + FERRITIN
Ferritin: 137.7 ng/mL (ref 10.0–291.0)
Iron: 53 ug/dL (ref 42–145)
Saturation Ratios: 15.1 % — ABNORMAL LOW (ref 20.0–50.0)
TIBC: 350 ug/dL (ref 250.0–450.0)
Transferrin: 250 mg/dL (ref 212.0–360.0)

## 2024-07-10 LAB — PROTIME-INR
INR: 1.1 ratio — ABNORMAL HIGH (ref 0.8–1.0)
Prothrombin Time: 11.5 s (ref 9.6–13.1)

## 2024-07-10 NOTE — Progress Notes (Signed)
 Chief Complaint: Follow-up GERD  HPI:    Andrea Rhodes is a 53 year old African-American female with a past medical history of psoriatic arthritis, rheumatoid arthritis, reflux, and fatty liver disease presents for follow up of GERD and to discuss fatty liver disease    07/01/2020 colonoscopy for screening with normal colon and a small amount of deflated external hemorrhoid tissue. Repeat recommended in 10 years.     03/31/2022 EGD Dr. Teressa with mild nonspecific distal gastritis and a few small typical appearing fundic gland polyps.  At that time noted to have nonerosive GERD.  Pathology showed mild chronic gastritis.   She has been tried on several therapies for her psoriatic arthritis and RA in the past  Interval History: She had to temporarily increase the dosage of her omeprazole , but then she was subsequently able to decrease her dosage back down. Endorses famotidine  20 mg every other day that she uses as needed. Denies heartburn or regurgitation on this current regimen. Denies N&V or dysphagia. Denies ab pain. She was told that she had elevated LFTs as checked by her rheumatologist. Her rheumatoid physician did a CT scan that showed fatty liver. Recently her pravastatin was reduced from 20 mg to 10 mg daily. Leflunomide is also on pause due to her elevated LFTs. Denies alcohol use. Denies prior exposures to viral hepatitis to her knowledge.  Past Medical History:  Diagnosis Date   ARTHRITIS    Arthritis    Asthma    ASTHMA, UNSPECIFIED    Carpal tunnel syndrome    CONSTIPATION    Diabetes mellitus without complication (HCC)    FATTY LIVER DISEASE    Fibromyalgia    GERD    HYPERTENSION    Irritable bowel syndrome    NEPHROLITHIASIS, HX OF    OBESITY    Psoriasis     Past Surgical History:  Procedure Laterality Date   BLADDER SURGERY     laparoscopy 1984     VAGINAL HYSTERECTOMY      Current Outpatient Medications  Medication Sig Dispense Refill   albuterol  (PROVENTIL HFA;VENTOLIN HFA) 108 (90 BASE) MCG/ACT inhaler Inhale 2 puffs into the lungs every 4 (four) hours as needed for wheezing or shortness of breath.     betamethasone  valerate (VALISONE ) 0.1 % cream Apply topically as needed.     bimekizumab-bkzx (BIMZELX) 160 MG/ML prefilled syringe Inject 160 mg into the skin every 28 (twenty-eight) days.     budesonide-formoterol (SYMBICORT) 160-4.5 MCG/ACT inhaler Inhale 2 puffs into the lungs as needed.     Cholecalciferol (VITAMIN D) 2000 UNITS CAPS Take 1 capsule by mouth daily.     clobetasol ointment (TEMOVATE) 0.05 % Apply 1 Application topically as needed.     estradiol (VIVELLE-DOT) 0.1 MG/24HR patch Place 1 patch onto the skin 2 (two) times a week.     famotidine  (PEPCID ) 20 MG tablet Take 1 tablet (20 mg total) by mouth daily. 90 tablet 3   folic acid (FOLVITE) 1 MG tablet Take 1 mg by mouth daily. Patient takes 2 tablets daily     lisinopril-hydrochlorothiazide (ZESTORETIC) 20-25 MG tablet Take 1 tablet by mouth daily.     methocarbamol (ROBAXIN) 500 MG tablet Take 500 mg by mouth daily.     MOUNJARO 5 MG/0.5ML Pen Inject 5 mg into the skin once a week.     Omega-3 Fatty Acids (FT FISH OIL) 650 MG CPDR Take 1 capsule by mouth daily.     omeprazole  (PRILOSEC) 20 MG capsule Take  20 mg by mouth daily.     pravastatin (PRAVACHOL) 10 MG tablet Take 10 mg by mouth at bedtime.     traZODone (DESYREL) 50 MG tablet Take 50 mg by mouth at bedtime.     No current facility-administered medications for this visit.    Allergies as of 07/10/2024 - Review Complete 07/10/2024  Allergen Reaction Noted   Meperidine hcl  01/07/2011   Nystatin   01/07/2011   Other  11/15/2014    Family History  Problem Relation Age of Onset   Diabetes Mother    Hypertension Mother    Lung cancer Father        PTSD   Multiple sclerosis Maternal Aunt        no family hx   Glaucoma Maternal Grandmother    Breast cancer Cousin 69   Cancer Other        1st cousin,  Breast CA, deceased at 38y   Colon cancer Neg Hx    Esophageal cancer Neg Hx    Rectal cancer Neg Hx    Stomach cancer Neg Hx     Social History   Socioeconomic History   Marital status: Married    Spouse name: Not on file   Number of children: Not on file   Years of education: Not on file   Highest education level: Not on file  Occupational History   Not on file  Tobacco Use   Smoking status: Never   Smokeless tobacco: Never  Vaping Use   Vaping status: Never Used  Substance and Sexual Activity   Alcohol use: No    Alcohol/week: 0.0 standard drinks of alcohol   Drug use: No   Sexual activity: Not on file  Other Topics Concern   Not on file  Social History Narrative   Caffeine 3 x week, 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     Social Drivers of Corporate investment banker Strain: Not on file  Food Insecurity: Low Risk  (06/11/2024)   Received from [facility]   Hunger Vital Sign    Within the past 12 months, you worried that your food would run out before you got money to buy more: Never true    Within the past 12 months, the food you bought just didn't last and you didn't have money to get more. : Never true  Transportation Needs: No Transportation Needs (06/11/2024)   Received from [facility]    In the past 12 months, has lack of reliable transportation kept you from medical appointments, meetings, work or from getting things needed for daily living? : No  Physical Activity: Not on file  Stress: Not on file  Social Connections: Not on file  Intimate Partner Violence: Not on file     Physical Exam:  Vital signs: BP (!) 140/90 (BP Location: Left Arm, Patient Position: Sitting, Cuff Size: Normal)   Pulse 88   Ht 5' 2 (1.575 m) Comment: height measured without shoes  Wt 181 lb 2 oz (82.2 kg)   BMI 33.13 kg/m    Constitutional:   Pleasant AA female appears to be in NAD, Well developed, Well nourished, alert and  cooperative Respiratory: Respirations even and unlabored. Lungs clear to auscultation bilaterally.   No wheezes, crackles, or rhonchi.  Cardiovascular: Normal S1, S2. No MRG. Regular rate and rhythm. No peripheral edema, cyanosis or pallor.  Gastrointestinal:  Soft, nondistended, nontender. No rebound or guarding. Normal bowel sounds. No  appreciable masses or hepatomegaly. Rectal:  Not performed.  Psychiatric: Demonstrates good judgement and reason without abnormal affect or behaviors.  RELEVANT LABS AND IMAGING: Labs 05/2024: LFTs with AST 35 and elevated ALT 51. T bili was normal at 0.3. Alk phos was normal at 57.   Assessment: Fatty liver. She recently had elevated LFTs seen on her labs in 05/2024 with mildly elevated ALT. She was also noted on a CT scan to have fatty liver as well as hepatomegaly. This may be related to some non-alcoholic fatty liver disease. With her history of autoimmune disease will rule out alternative sources of liver disease as well and rule out viral hepatitis. Will also plan to proceed with elastography to evaluate the degree of liver fibrosis present.  GERD: EGD in April 2023 of mild chronic gastritis, seems to be doing well on Omeprazole  40 mg daily and the occasional Famotidine  20 mg as needed  Plan: - Check CBC, LFTs, PT/INR, ferritin/IBC, hepatitis A antibody, hepatitis B surface antibody, hepatitis B surface antigen, IgG, ANA, ASMA - U/S with elastography - Continue omeprazole  and famotidine   - Encourage weight loss - Next colonoscopy due in 2031 for colon cancer screening  Estefana Kidney, MD [facility] 07/10/2024, 1:13 PM  I spent 33 minutes of time, including in depth chart review, independent review of results as outlined above, communicating results with the patient directly, face-to-face time with the patient, coordinating care, and ordering studies and medications as appropriate, and documentation.

## 2024-07-10 NOTE — Patient Instructions (Signed)
 Your provider has requested that you go to the basement level for lab work before leaving today. Press B on the elevator. The lab is located at the first door on the left as you exit the elevator.   You have been scheduled for an abdominal ultrasound at [facility] (1st floor of hospital) on 07/12/24 at 8:30 am. Please arrive 30 minutes prior to your appointment for registration. Make certain not to have anything to eat or drink after midnight. Should you need to reschedule your appointment, please contact radiology at [phone]. This test typically takes about 30 minutes to perform.   If your blood pressure at your visit was 140/90 or greater, please contact your primary care physician to follow up on this.  _______________________________________________________  If you are age 54 or older, your body mass index should be between 23-30. Your Body mass index is 33.13 kg/m. If this is out of the aforementioned range listed, please consider follow up with your Primary Care Provider.  If you are age 26 or younger, your body mass index should be between 19-25. Your Body mass index is 33.13 kg/m. If this is out of the aformentioned range listed, please consider follow up with your Primary Care Provider.   ________________________________________________________  The [facility] providers would like to encourage you to use MYCHART to communicate with providers for non-urgent requests or questions.  Due to long hold times on the telephone, sending your provider a message by [facility] may be a faster and more efficient way to get a response.  Please allow 48 business hours for a response.  Please remember that this is for non-urgent requests.  _______________________________________________________  Due to recent changes in healthcare laws, you may see the results of your imaging and laboratory studies on MyChart before your provider has had a chance to review them.  We understand that in  some cases there may be results that are confusing or concerning to you. Not all laboratory results come back in the same time frame and the provider may be waiting for multiple results in order to interpret others.  Please give us  48 hours in order for your provider to thoroughly review all the results before contacting the office for clarification of your results.   Thank you for entrusting me with your care and for choosing [facility], Dr. Estefana Kidney

## 2024-07-12 ENCOUNTER — Ambulatory Visit: Payer: Self-pay | Admitting: Internal Medicine

## 2024-07-12 ENCOUNTER — Ambulatory Visit (HOSPITAL_COMMUNITY)
Admission: RE | Admit: 2024-07-12 | Discharge: 2024-07-12 | Disposition: A | Discharge status: Home / Self Care | Source: Ambulatory Visit | Attending: Internal Medicine | Admitting: Internal Medicine

## 2024-07-12 DIAGNOSIS — M25511 Pain in right shoulder: Secondary | ICD-10-CM | POA: Diagnosis not present

## 2024-07-12 DIAGNOSIS — K760 Fatty (change of) liver, not elsewhere classified: Secondary | ICD-10-CM | POA: Diagnosis not present

## 2024-07-12 DIAGNOSIS — K219 Gastro-esophageal reflux disease without esophagitis: Secondary | ICD-10-CM | POA: Insufficient documentation

## 2024-07-12 DIAGNOSIS — R160 Hepatomegaly, not elsewhere classified: Secondary | ICD-10-CM | POA: Diagnosis not present

## 2024-07-12 DIAGNOSIS — R7989 Other specified abnormal findings of blood chemistry: Secondary | ICD-10-CM | POA: Diagnosis not present

## 2024-07-12 DIAGNOSIS — M25512 Pain in left shoulder: Secondary | ICD-10-CM | POA: Diagnosis not present

## 2024-07-12 DIAGNOSIS — M542 Cervicalgia: Secondary | ICD-10-CM | POA: Diagnosis not present

## 2024-07-12 DIAGNOSIS — K769 Liver disease, unspecified: Secondary | ICD-10-CM | POA: Diagnosis not present

## 2024-07-12 LAB — ANTI-NUCLEAR AB-TITER (ANA TITER)
ANA TITER: 1:160 {titer} — ABNORMAL HIGH
ANA Titer 1: 1:80 {titer} — ABNORMAL HIGH

## 2024-07-12 LAB — ANA: Anti Nuclear Antibody (ANA): POSITIVE — AB

## 2024-07-12 LAB — HEPATITIS A ANTIBODY, TOTAL: Hepatitis A AB,Total: NONREACTIVE

## 2024-07-12 LAB — HEPATITIS B SURFACE ANTIBODY,QUALITATIVE: Hep B S Ab: NONREACTIVE

## 2024-07-12 LAB — ANTI-SMOOTH MUSCLE ANTIBODY, IGG: Actin (Smooth Muscle) Antibody (IGG): <20 U (ref <20)

## 2024-07-12 LAB — HEPATITIS C ANTIBODY: Hepatitis C Ab: NONREACTIVE

## 2024-07-12 LAB — IGG: IgG (Immunoglobin G), Serum: 1016 mg/dL (ref 600–1640)

## 2024-07-12 LAB — HEPATITIS B SURFACE ANTIGEN: Hepatitis B Surface Ag: NONREACTIVE

## 2024-07-13 ENCOUNTER — Other Ambulatory Visit: Payer: Self-pay

## 2024-07-13 ENCOUNTER — Encounter: Payer: Self-pay | Admitting: Internal Medicine

## 2024-07-25 DIAGNOSIS — L400 Psoriasis vulgaris: Secondary | ICD-10-CM | POA: Diagnosis not present

## 2024-07-25 DIAGNOSIS — L2089 Other atopic dermatitis: Secondary | ICD-10-CM | POA: Diagnosis not present

## 2024-08-16 ENCOUNTER — Ambulatory Visit: Admitting: Pulmonary Disease

## 2024-08-23 DIAGNOSIS — J40 Bronchitis, not specified as acute or chronic: Secondary | ICD-10-CM | POA: Diagnosis not present

## 2024-08-23 DIAGNOSIS — R053 Chronic cough: Secondary | ICD-10-CM | POA: Diagnosis not present

## 2024-08-30 ENCOUNTER — Encounter: Payer: Self-pay | Admitting: Cardiology

## 2024-08-30 DIAGNOSIS — J4540 Moderate persistent asthma, uncomplicated: Secondary | ICD-10-CM | POA: Diagnosis not present

## 2024-08-30 DIAGNOSIS — J40 Bronchitis, not specified as acute or chronic: Secondary | ICD-10-CM | POA: Diagnosis not present

## 2024-08-30 DIAGNOSIS — L4050 Arthropathic psoriasis, unspecified: Secondary | ICD-10-CM | POA: Diagnosis not present

## 2024-09-20 DIAGNOSIS — R5383 Other fatigue: Secondary | ICD-10-CM | POA: Diagnosis not present

## 2024-09-20 DIAGNOSIS — M79643 Pain in unspecified hand: Secondary | ICD-10-CM | POA: Diagnosis not present

## 2024-09-20 DIAGNOSIS — M797 Fibromyalgia: Secondary | ICD-10-CM | POA: Diagnosis not present

## 2024-09-20 DIAGNOSIS — L4050 Arthropathic psoriasis, unspecified: Secondary | ICD-10-CM | POA: Diagnosis not present

## 2024-09-21 DIAGNOSIS — H01021 Squamous blepharitis right upper eyelid: Secondary | ICD-10-CM | POA: Diagnosis not present

## 2024-09-21 DIAGNOSIS — H0011 Chalazion right upper eyelid: Secondary | ICD-10-CM | POA: Diagnosis not present

## 2024-09-21 DIAGNOSIS — H01024 Squamous blepharitis left upper eyelid: Secondary | ICD-10-CM | POA: Diagnosis not present

## 2024-09-26 DIAGNOSIS — L2389 Allergic contact dermatitis due to other agents: Secondary | ICD-10-CM | POA: Diagnosis not present

## 2024-10-10 DIAGNOSIS — M25552 Pain in left hip: Secondary | ICD-10-CM | POA: Diagnosis not present

## 2024-10-10 DIAGNOSIS — M7062 Trochanteric bursitis, left hip: Secondary | ICD-10-CM | POA: Diagnosis not present

## 2024-10-22 NOTE — Progress Notes (Unsigned)
 Cardiology Office Note:    Date:  10/23/2024   ID:  Andrea Rhodes, DOB [birth year], MRN [id number]  PCP:  Loretha Richerd SAUNDERS, MD  Cardiologist:  Lonni LITTIE Nanas, MD  Electrophysiologist:  None   Referring MD: Loretha Richerd SAUNDERS, MD   Chief Complaint  Patient presents with   Palpitations    History of Present Illness:    Andrea Rhodes is a 53 y.o. female with a hx of hypertension, asthma, GERD, PVCs, fibromyalgia, psoriatic arthritis who presents for follow-up.  She was referred by Harlene Bill, NP for evaluation of palpitations and chest pain, initially seen 05/28/2021.  Echocardiogram 05/2021 showed normal biventricular function, no significant valvular disease.  Coronary CTA 05/2021 showed normal coronary arteries, calcium score 0.  Zio patch x 3 days 05/2021 showed no significant arrhythmias.  Since last clinic visit, she reports she is doing okay.  Has had some fluttering recently.  Last for 2 to 3 minutes.  Occurs about once per month.  Occurs randomly.  States that she feels sharp pain, can occur with deep breathing; reports pain not in chest but in left upper abdomen.  Past Medical History:  Diagnosis Date   ARTHRITIS    Arthritis    Asthma    ASTHMA, UNSPECIFIED    Carpal tunnel syndrome    CONSTIPATION    Diabetes mellitus without complication (HCC)    FATTY LIVER DISEASE    Fibromyalgia    GERD    HYPERTENSION    Irritable bowel syndrome    NEPHROLITHIASIS, HX OF    OBESITY    Psoriasis     Past Surgical History:  Procedure Laterality Date   BLADDER SURGERY     laparoscopy 1984     VAGINAL HYSTERECTOMY      Current Medications: Current Meds  Medication Sig   albuterol (PROVENTIL HFA;VENTOLIN HFA) 108 (90 BASE) MCG/ACT inhaler Inhale 2 puffs into the lungs every 4 (four) hours as needed for wheezing or shortness of breath.   betamethasone  valerate (VALISONE ) 0.1 % cream Apply topically as needed.   bimekizumab-bkzx (BIMZELX) 160 MG/ML  prefilled syringe Inject 160 mg into the skin every 28 (twenty-eight) days.   budesonide-formoterol (SYMBICORT) 160-4.5 MCG/ACT inhaler Inhale 2 puffs into the lungs as needed.   Cholecalciferol (VITAMIN D) 2000 UNITS CAPS Take 1 capsule by mouth daily.   clobetasol ointment (TEMOVATE) 0.05 % Apply 1 Application topically as needed.   estradiol (ESTRACE) 1 MG tablet Take 1 mg by mouth daily.   famotidine  (PEPCID ) 20 MG tablet Take 1 tablet (20 mg total) by mouth daily.   folic acid (FOLVITE) 1 MG tablet Take 1 mg by mouth daily. Patient takes 2 tablets daily   lisinopril-hydrochlorothiazide (ZESTORETIC) 20-25 MG tablet Take 1 tablet by mouth daily.   melatonin 5 MG TABS Take 5 mg by mouth at bedtime as needed (sleep).   methocarbamol (ROBAXIN) 500 MG tablet Take 500 mg by mouth daily. (Patient taking differently: Take 500 mg by mouth daily as needed for muscle spasms.)   MOUNJARO 5 MG/0.5ML Pen Inject 5 mg into the skin once a week.   naproxen (NAPROSYN) 500 MG tablet Take 500 mg by mouth 2 (two) times daily as needed.   Omega-3 Fatty Acids (FT FISH OIL) 650 MG CPDR Take 1 capsule by mouth daily.   omeprazole  (PRILOSEC) 20 MG capsule Take 20 mg by mouth daily. (Patient taking differently: Take 40 mg by mouth daily.)   pimecrolimus (ELIDEL) 1 % cream Apply 1 Application  topically daily as needed (itching).   pravastatin (PRAVACHOL) 10 MG tablet Take 10 mg by mouth at bedtime.   predniSONE (DELTASONE) 1 MG tablet Take 1 mg by mouth daily.   traZODone (DESYREL) 50 MG tablet Take 50 mg by mouth at bedtime. (Patient taking differently: Take 50 mg by mouth at bedtime as needed for sleep.)   XDEMVY 0.25 % SOLN Place 1 drop into both eyes in the morning and at bedtime.     Allergies:   Meperidine hcl, Nystatin , and Other   Social History   Socioeconomic History   Marital status: Married    Spouse name: Not on file   Number of children: Not on file   Years of education: Not on file   Highest  education level: Not on file  Occupational History   Not on file  Tobacco Use   Smoking status: Never   Smokeless tobacco: Never  Vaping Use   Vaping status: Never Used  Substance and Sexual Activity   Alcohol use: No    Alcohol/week: 0.0 standard drinks of alcohol   Drug use: No   Sexual activity: Not on file  Other Topics Concern   Not on file  Social History Narrative   Caffeine 3 x week, FT Medical Coding compliance auditor at [employer], Married, 2 kids.     Social Drivers of Corporate Investment Banker Strain: Not on file  Food Insecurity: Low Risk  (06/11/2024)   Received from [facility]   Hunger Vital Sign    Within the past 12 months, you worried that your food would run out before you got money to buy more: Never true    Within the past 12 months, the food you bought just didn't last and you didn't have money to get more. : Never true  Transportation Needs: No Transportation Needs (06/11/2024)   Received from [facility]    In the past 12 months, has lack of reliable transportation kept you from medical appointments, meetings, work or from getting things needed for daily living? : No  Physical Activity: Not on file  Stress: Not on file  Social Connections: Not on file     Family History: The patient's family history includes Breast cancer (age of onset: 36) in her cousin; Cancer in an other family member; Diabetes in her mother; Glaucoma in her maternal grandmother; Hypertension in her mother; Lung cancer in her father; Multiple sclerosis in her maternal aunt. There is no history of Colon cancer, Esophageal cancer, Rectal cancer, or Stomach cancer.  ROS:   Please see the history of present illness.     All other systems reviewed and are negative.  EKG's/Labs/Other Studies Reviewed:    The following studies were reviewed today:   EKG:   05/28/2021- The EKG ordered today demonstrates sinus rhythm, rate 76, no S/T abnormalities   10/23/2024:  Normal sinus rhythm, rate 77, no ST abnormality  Recent Labs: 07/10/2024: ALT 40; Hemoglobin 14.2; Platelets 344.0  Recent Lipid Panel No results found for: CHOL, TRIG, HDL, CHOLHDL, VLDL, LDLCALC, LDLDIRECT  Physical Exam:    VS:  BP 112/68 (BP Location: Left Arm, Patient Position: Sitting, Cuff Size: Normal)   Pulse 75   Ht 5' 3 (1.6 m)   Wt 175 lb 3.2 oz (79.5 kg)   SpO2 97%   BMI 31.04 kg/m     Wt Readings from Last 3 Encounters:  10/23/24 175 lb 3.2 oz (79.5 kg)  07/10/24 181 lb 2  oz (82.2 kg)  12/16/23 185 lb 6 oz (84.1 kg)     GEN:  Well nourished, well developed in no acute distress HEENT: Normal NECK: No JVD; No carotid bruits LYMPHATICS: No lymphadenopathy CARDIAC: RRR, no murmurs, rubs, gallops RESPIRATORY:  Clear to auscultation without rales, wheezing or rhonchi  ABDOMEN: Soft, non-tender, non-distended MUSCULOSKELETAL:  No edema; No deformity  SKIN: Warm and dry NEUROLOGIC:  Alert and oriented x 3 PSYCHIATRIC:  Normal affect   ASSESSMENT:    1. Palpitations   2. Atypical chest pain   3. Hypertension, unspecified type   4. Hyperlipidemia, unspecified hyperlipidemia type     PLAN:    Palpitations:  Zio patch x 3 days 05/2021 showed no significant arrhythmias. - Reports worsening palpitations recently, with Zio patch x 14 days  Chest pain: Atypical in description.Echocardiogram 05/2021 showed normal biventricular function, no significant valvular disease.  Coronary CTA 05/2021 showed normal coronary arteries, calcium score 0.    Hypertension:on lisinopril-HCTZ 20-25 mg daily. Appears controlled.  Hyperlipidemia: on pravastatin 10 mg daily.  LDL 68 on 06/2024  T2DM: on Mounjaro.  A1c 6.3% on 06/2024  Obesity: Body mass index is 31.04 kg/m. Has lost 20 lbs on Mounjaro  RTC in 1 year    Medication Adjustments/Labs and Tests Ordered: Current medicines are reviewed at length with the patient today.  Concerns regarding medicines are  outlined above.  Orders Placed This Encounter  Procedures   LONG TERM MONITOR (3-14 DAYS)   EKG 12-Lead   No orders of the defined types were placed in this encounter.   Patient Instructions  Medication Instructions:  Your physician recommends that you continue on your current medications as directed. Please refer to the Current Medication list given to you today.  *If you need a refill on your cardiac medications before your next appointment, please call your pharmacy*  Lab Work: none If you have labs (blood work) drawn today and your tests are completely normal, you will receive your results only by: MyChart Message (if you have MyChart) OR A paper copy in the mail If you have any lab test that is abnormal or we need to change your treatment, we will call you to review the results.  Testing/Procedures: Dr Kate has requested you wear a zio monitor for 2 weeks.    Follow-Up: At [facility], you and your health needs are our priority.  As part of our continuing mission to provide you with exceptional heart care, our providers are all part of one team.  This team includes your primary Cardiologist (physician) and Advanced Practice Providers or APPs (Physician Assistants and Nurse Practitioners) who all work together to provide you with the care you need, when you need it.  Your next appointment:   12 month(s)  Provider:   Lonni LITTIE Kate, MD    We recommend signing up for the patient portal called MyChart.  Sign up information is provided on this After Visit Summary.  MyChart is used to connect with patients for Virtual Visits (Telemedicine).  Patients are able to view lab/test results, encounter notes, upcoming appointments, etc.  Non-urgent messages can be sent to your provider as well.   To learn more about what you can do with MyChart, go to [email or web address].   Other Instructions  ZIO XT- Long Term Monitor Instructions  Your physician has  requested you wear a ZIO patch monitor for 14 days.  This is a single patch monitor. [employer] supplies one patch monitor per enrollment. Additional  stickers are not available. Please do not apply patch if you will be having a Nuclear Stress Test,  Echocardiogram, Cardiac CT, MRI, or Chest Xray during the period you would be wearing the  monitor. The patch cannot be worn during these tests. You cannot remove and re-apply the  ZIO XT patch monitor.  Your ZIO patch monitor will be mailed 3 day USPS to your address on file. It may take 3-5 days  to receive your monitor after you have been enrolled.  Once you have received your monitor, please review the enclosed instructions. Your monitor  has already been registered assigning a specific monitor serial # to you.  Billing and Patient Assistance Program Information  We have supplied [employer] with any of your insurance information on file for billing purposes. [employer] offers a sliding scale Patient Assistance Program for patients that do not have  insurance, or whose insurance does not completely cover the cost of the ZIO monitor.  You must apply for the Patient Assistance Program to qualify for this discounted rate.  To apply, please call [employer] at [phone], select option 4, select option 2, ask to apply for  Patient Assistance Program. Meredeth will ask your household income, and how many people  are in your household. They will quote your out-of-pocket cost based on that information.  [employer] will also be able to set up a 18-month, interest-free payment plan if needed.  Applying the monitor   Shave hair from upper left chest.  Hold abrader disc by orange tab. Rub abrader in 40 strokes over the upper left chest as  indicated in your monitor instructions.  Clean area with 4 enclosed alcohol pads. Let dry.  Apply patch as indicated in monitor instructions. Patch will be placed under collarbone on left  side of chest with arrow pointing  upward.  Rub patch adhesive wings for 2 minutes. Remove white label marked 1. Remove the white  label marked 2. Rub patch adhesive wings for 2 additional minutes.  While looking in a mirror, press and release button in center of patch. A small green light will  flash 3-4 times. This will be your only indicator that the monitor has been turned on.  Do not shower for the first 24 hours. You may shower after the first 24 hours.  Press the button if you feel a symptom. You will hear a small click. Record Date, Time and  Symptom in the Patient Logbook.  When you are ready to remove the patch, follow instructions on the last 2 pages of Patient  Logbook. Stick patch monitor onto the last page of Patient Logbook.  Place Patient Logbook in the blue and white box. Use locking tab on box and tape box closed  securely. The blue and white box has prepaid postage on it. Please place it in the mailbox as  soon as possible. Your physician should have your test results approximately 7 days after the  monitor has been mailed back to [facility].  Call [facility] Customer Care at [phone] if you have questions regarding  your ZIO XT patch monitor. Call them immediately if you see an orange light blinking on your  monitor.  If your monitor falls off in less than 4 days, contact our Monitor department at [phone].  If your monitor becomes loose or falls off after 4 days call [employer] at [phone] for  suggestions on securing your monitor             Signed, Lonni CROME  Kate, MD  10/23/2024 2:51 PM    [facility]

## 2024-10-23 ENCOUNTER — Ambulatory Visit: Attending: Cardiology | Admitting: Cardiology

## 2024-10-23 ENCOUNTER — Ambulatory Visit

## 2024-10-23 VITALS — BP 112/68 | HR 75 | Ht 63.0 in | Wt 175.2 lb

## 2024-10-23 DIAGNOSIS — R0789 Other chest pain: Secondary | ICD-10-CM

## 2024-10-23 DIAGNOSIS — R002 Palpitations: Secondary | ICD-10-CM | POA: Diagnosis not present

## 2024-10-23 DIAGNOSIS — E785 Hyperlipidemia, unspecified: Secondary | ICD-10-CM

## 2024-10-23 DIAGNOSIS — I10 Essential (primary) hypertension: Secondary | ICD-10-CM

## 2024-10-23 NOTE — Progress Notes (Unsigned)
 Enrolled for [employer] to mail a ZIO XT long term holter monitor to the patients address on file.

## 2024-10-23 NOTE — Patient Instructions (Signed)
 Medication Instructions:  Your physician recommends that you continue on your current medications as directed. Please refer to the Current Medication list given to you today.  *If you need a refill on your cardiac medications before your next appointment, please call your pharmacy*  Lab Work: none If you have labs (blood work) drawn today and your tests are completely normal, you will receive your results only by: MyChart Message (if you have MyChart) OR A paper copy in the mail If you have any lab test that is abnormal or we need to change your treatment, we will call you to review the results.  Testing/Procedures: Dr Kate has requested you wear a zio monitor for 2 weeks.    Follow-Up: At [facility], you and your health needs are our priority.  As part of our continuing mission to provide you with exceptional heart care, our providers are all part of one team.  This team includes your primary Cardiologist (physician) and Advanced Practice Providers or APPs (Physician Assistants and Nurse Practitioners) who all work together to provide you with the care you need, when you need it.  Your next appointment:   12 month(s)  Provider:   Lonni LITTIE Kate, MD    We recommend signing up for the patient portal called MyChart.  Sign up information is provided on this After Visit Summary.  MyChart is used to connect with patients for Virtual Visits (Telemedicine).  Patients are able to view lab/test results, encounter notes, upcoming appointments, etc.  Non-urgent messages can be sent to your provider as well.   To learn more about what you can do with MyChart, go to [email or web address].   Other Instructions  ZIO XT- Long Term Monitor Instructions  Your physician has requested you wear a ZIO patch monitor for 14 days.  This is a single patch monitor. [employer] supplies one patch monitor per enrollment. Additional stickers are not available. Please do not apply patch if  you will be having a Nuclear Stress Test,  Echocardiogram, Cardiac CT, MRI, or Chest Xray during the period you would be wearing the  monitor. The patch cannot be worn during these tests. You cannot remove and re-apply the  ZIO XT patch monitor.  Your ZIO patch monitor will be mailed 3 day USPS to your address on file. It may take 3-5 days  to receive your monitor after you have been enrolled.  Once you have received your monitor, please review the enclosed instructions. Your monitor  has already been registered assigning a specific monitor serial # to you.  Billing and Patient Assistance Program Information  We have supplied [employer] with any of your insurance information on file for billing purposes. [employer] offers a sliding scale Patient Assistance Program for patients that do not have  insurance, or whose insurance does not completely cover the cost of the ZIO monitor.  You must apply for the Patient Assistance Program to qualify for this discounted rate.  To apply, please call [employer] at [phone], select option 4, select option 2, ask to apply for  Patient Assistance Program. Meredeth will ask your household income, and how many people  are in your household. They will quote your out-of-pocket cost based on that information.  [employer] will also be able to set up a 71-month, interest-free payment plan if needed.  Applying the monitor   Shave hair from upper left chest.  Hold abrader disc by orange tab. Rub abrader in 40 strokes over the upper left chest as  indicated  in your monitor instructions.  Clean area with 4 enclosed alcohol pads. Let dry.  Apply patch as indicated in monitor instructions. Patch will be placed under collarbone on left  side of chest with arrow pointing upward.  Rub patch adhesive wings for 2 minutes. Remove white label marked 1. Remove the white  label marked 2. Rub patch adhesive wings for 2 additional minutes.  While looking in a mirror, press and  release button in center of patch. A small green light will  flash 3-4 times. This will be your only indicator that the monitor has been turned on.  Do not shower for the first 24 hours. You may shower after the first 24 hours.  Press the button if you feel a symptom. You will hear a small click. Record Date, Time and  Symptom in the Patient Logbook.  When you are ready to remove the patch, follow instructions on the last 2 pages of Patient  Logbook. Stick patch monitor onto the last page of Patient Logbook.  Place Patient Logbook in the blue and white box. Use locking tab on box and tape box closed  securely. The blue and white box has prepaid postage on it. Please place it in the mailbox as  soon as possible. Your physician should have your test results approximately 7 days after the  monitor has been mailed back to [facility].  Call [facility] Customer Care at [phone] if you have questions regarding  your ZIO XT patch monitor. Call them immediately if you see an orange light blinking on your  monitor.  If your monitor falls off in less than 4 days, contact our Monitor department at [phone].  If your monitor becomes loose or falls off after 4 days call [employer] at [phone] for  suggestions on securing your monitor

## 2024-10-29 ENCOUNTER — Other Ambulatory Visit: Payer: Self-pay | Admitting: Obstetrics and Gynecology

## 2024-10-29 DIAGNOSIS — Z1231 Encounter for screening mammogram for malignant neoplasm of breast: Secondary | ICD-10-CM

## 2024-11-06 DIAGNOSIS — J4520 Mild intermittent asthma, uncomplicated: Secondary | ICD-10-CM | POA: Diagnosis not present

## 2024-11-06 DIAGNOSIS — J302 Other seasonal allergic rhinitis: Secondary | ICD-10-CM | POA: Diagnosis not present

## 2024-11-06 DIAGNOSIS — L259 Unspecified contact dermatitis, unspecified cause: Secondary | ICD-10-CM | POA: Diagnosis not present

## 2024-11-13 DIAGNOSIS — H01024 Squamous blepharitis left upper eyelid: Secondary | ICD-10-CM | POA: Diagnosis not present

## 2024-11-13 DIAGNOSIS — H01021 Squamous blepharitis right upper eyelid: Secondary | ICD-10-CM | POA: Diagnosis not present

## 2024-11-13 DIAGNOSIS — H0011 Chalazion right upper eyelid: Secondary | ICD-10-CM | POA: Diagnosis not present

## 2024-11-24 ENCOUNTER — Encounter (HOSPITAL_BASED_OUTPATIENT_CLINIC_OR_DEPARTMENT_OTHER): Payer: Self-pay | Admitting: Emergency Medicine

## 2024-11-24 ENCOUNTER — Emergency Department (HOSPITAL_BASED_OUTPATIENT_CLINIC_OR_DEPARTMENT_OTHER)
Admission: EM | Admit: 2024-11-24 | Discharge: 2024-11-24 | Disposition: A | Discharge status: Home / Self Care | Attending: Emergency Medicine | Admitting: Emergency Medicine

## 2024-11-24 ENCOUNTER — Emergency Department (HOSPITAL_BASED_OUTPATIENT_CLINIC_OR_DEPARTMENT_OTHER)

## 2024-11-24 DIAGNOSIS — D72829 Elevated white blood cell count, unspecified: Secondary | ICD-10-CM | POA: Diagnosis not present

## 2024-11-24 DIAGNOSIS — J45909 Unspecified asthma, uncomplicated: Secondary | ICD-10-CM | POA: Diagnosis not present

## 2024-11-24 DIAGNOSIS — K5900 Constipation, unspecified: Secondary | ICD-10-CM | POA: Diagnosis not present

## 2024-11-24 DIAGNOSIS — Z79899 Other long term (current) drug therapy: Secondary | ICD-10-CM | POA: Insufficient documentation

## 2024-11-24 DIAGNOSIS — K529 Noninfective gastroenteritis and colitis, unspecified: Secondary | ICD-10-CM | POA: Insufficient documentation

## 2024-11-24 DIAGNOSIS — I10 Essential (primary) hypertension: Secondary | ICD-10-CM | POA: Diagnosis not present

## 2024-11-24 DIAGNOSIS — R1084 Generalized abdominal pain: Secondary | ICD-10-CM | POA: Diagnosis not present

## 2024-11-24 DIAGNOSIS — E119 Type 2 diabetes mellitus without complications: Secondary | ICD-10-CM | POA: Insufficient documentation

## 2024-11-24 DIAGNOSIS — R1032 Left lower quadrant pain: Secondary | ICD-10-CM | POA: Diagnosis not present

## 2024-11-24 LAB — URINALYSIS, ROUTINE W REFLEX MICROSCOPIC
Bilirubin Urine: NEGATIVE
Glucose, UA: NEGATIVE mg/dL
Ketones, ur: 15 mg/dL — AB
Leukocytes,Ua: NEGATIVE
Nitrite: NEGATIVE
Protein, ur: NEGATIVE mg/dL
Specific Gravity, Urine: 1.02 (ref 1.005–1.030)
pH: 7.5 (ref 5.0–8.0)

## 2024-11-24 LAB — CBC WITH DIFFERENTIAL/PLATELET
Abs Immature Granulocytes: 0.03 K/uL (ref 0.00–0.07)
Basophils Absolute: 0 K/uL (ref 0.0–0.1)
Basophils Relative: 0 %
Eosinophils Absolute: 0 K/uL (ref 0.0–0.5)
Eosinophils Relative: 0 %
HCT: 40.5 % (ref 36.0–46.0)
Hemoglobin: 13.8 g/dL (ref 12.0–15.0)
Immature Granulocytes: 0 %
Lymphocytes Relative: 10 %
Lymphs Abs: 1.3 K/uL (ref 0.7–4.0)
MCH: 30.6 pg (ref 26.0–34.0)
MCHC: 34.1 g/dL (ref 30.0–36.0)
MCV: 89.8 fL (ref 80.0–100.0)
Monocytes Absolute: 0.5 K/uL (ref 0.1–1.0)
Monocytes Relative: 4 %
Neutro Abs: 10.4 K/uL — ABNORMAL HIGH (ref 1.7–7.7)
Neutrophils Relative %: 86 %
Platelets: 342 K/uL (ref 150–400)
RBC: 4.51 MIL/uL (ref 3.87–5.11)
RDW: 11.9 % (ref 11.5–15.5)
WBC: 12.3 K/uL — ABNORMAL HIGH (ref 4.0–10.5)
nRBC: 0 % (ref 0.0–0.2)

## 2024-11-24 LAB — COMPREHENSIVE METABOLIC PANEL WITH GFR
ALT: 19 U/L (ref 0–44)
AST: 28 U/L (ref 15–41)
Albumin: 4.1 g/dL (ref 3.5–5.0)
Alkaline Phosphatase: 54 U/L (ref 38–126)
Anion gap: 15 (ref 5–15)
BUN: 12 mg/dL (ref 6–20)
CO2: 22 mmol/L (ref 22–32)
Calcium: 9.5 mg/dL (ref 8.9–10.3)
Chloride: 98 mmol/L (ref 98–111)
Creatinine, Ser: 0.63 mg/dL (ref 0.44–1.00)
GFR, Estimated: >60 mL/min (ref >60)
Glucose, Bld: 138 mg/dL — ABNORMAL HIGH (ref 70–99)
Potassium: 4 mmol/L (ref 3.5–5.1)
Sodium: 135 mmol/L (ref 135–145)
Total Bilirubin: 0.3 mg/dL (ref 0.0–1.2)
Total Protein: 7.1 g/dL (ref 6.5–8.1)

## 2024-11-24 LAB — URINALYSIS, MICROSCOPIC (REFLEX)

## 2024-11-24 LAB — LIPASE, BLOOD: Lipase: 19 U/L (ref 11–51)

## 2024-11-24 MED ORDER — IOHEXOL 300 MG/ML  SOLN
100.0000 mL | Freq: Once | INTRAMUSCULAR | Status: AC | PRN
  Administered 2024-11-24: 80 mL via INTRAVENOUS

## 2024-11-24 MED ORDER — SODIUM CHLORIDE 0.9 % IV BOLUS
1000.0000 mL | Freq: Once | INTRAVENOUS | Status: AC
Start: 2024-11-24 — End: 2024-11-24
  Administered 2024-11-24: 1000 mL via INTRAVENOUS

## 2024-11-24 MED ORDER — MORPHINE SULFATE (PF) 4 MG/ML IV SOLN
4.0000 mg | Freq: Once | INTRAVENOUS | Status: AC
  Administered 2024-11-24: 4 mg via INTRAVENOUS
  Filled 2024-11-24: qty 1

## 2024-11-24 MED ORDER — KETOROLAC TROMETHAMINE 15 MG/ML IJ SOLN
15.0000 mg | Freq: Once | INTRAMUSCULAR | Status: AC
  Administered 2024-11-24: 15 mg via INTRAVENOUS
  Filled 2024-11-24: qty 1

## 2024-11-24 MED ORDER — OXYCODONE HCL 5 MG PO TABS: 5.0000 mg | ORAL_TABLET | ORAL | 0 refills | Status: AC | PRN

## 2024-11-24 MED ORDER — DICYCLOMINE HCL 20 MG PO TABS
20.0000 mg | ORAL_TABLET | Freq: Two times a day (BID) | ORAL | 0 refills | Status: AC
Start: 2024-11-24 — End: ?

## 2024-11-24 MED ORDER — BISMUTH SUBSALICYLATE 262 MG PO CHEW: 524.0000 mg | CHEWABLE_TABLET | ORAL | 0 refills | Status: AC | PRN

## 2024-11-24 MED ORDER — ONDANSETRON HCL 4 MG/2ML IJ SOLN
4.0000 mg | Freq: Once | INTRAMUSCULAR | Status: AC
  Administered 2024-11-24: 4 mg via INTRAVENOUS
  Filled 2024-11-24: qty 2

## 2024-11-24 MED ORDER — DICYCLOMINE HCL 10 MG PO CAPS
10.0000 mg | ORAL_CAPSULE | Freq: Once | ORAL | Status: AC
  Administered 2024-11-24: 10 mg via ORAL
  Filled 2024-11-24: qty 1

## 2024-11-24 NOTE — ED Triage Notes (Signed)
 Pt states is constipated. Has tried laxatives and stool softeners. Abdominal pain started last night and blood that feels like diarrhea.

## 2024-11-24 NOTE — ED Provider Notes (Signed)
 [facility] Provider Note  CSN: [id number] Arrival date & time: 11/24/24 0630  Chief Complaint(s) Abdominal Pain and Constipation  HPI Andrea Rhodes is a 53 y.o. female with past medical history as below, significant for DM, constipation, hypertension, IBS, hysterectomy, obesity who presents to the ED with complaint of abdominal pain, constipation  Patient reports has been constipated over the past few days, is been taking laxatives with some relief.  Last night began developing severe, sharp, left lower quadrant periumbilical abdominal pain, nausea no vomiting.  Alternating chills and feeling feverish.  No urinary complaints.  Symptoms have progressively worsened over last night.  Denies similar symptoms in the past.  History of hysterectomy and bladder surgery, here with spouse.  Past Medical History Past Medical History:  Diagnosis Date   ARTHRITIS    Arthritis    Asthma    ASTHMA, UNSPECIFIED    Carpal tunnel syndrome    CONSTIPATION    Diabetes mellitus without complication (HCC)    FATTY LIVER DISEASE    Fibromyalgia    GERD    HYPERTENSION    Irritable bowel syndrome    NEPHROLITHIASIS, HX OF    OBESITY    Psoriasis    Patient Active Problem List   Diagnosis Date Noted   Insomnia due to stress 11/15/2014   Sleep apnea 11/15/2014   Nocturia 11/15/2014   Snoring 11/15/2014   Benign paroxysmal positional vertigo 11/15/2014   Chest pain 02/23/2012   Depression 02/23/2012   OBESITY 01/07/2011   Essential hypertension 01/07/2011   Asthma 01/07/2011   Arthropathy 01/07/2011   NEPHROLITHIASIS, HX OF 01/07/2011   GERD 01/05/2011   Constipation 01/05/2011   IRRITABLE BOWEL SYNDROME 01/05/2011   FATTY LIVER DISEASE 01/05/2011   Home Medication(s) Prior to Admission medications   Medication Sig Start Date End Date Taking? Authorizing Provider  bismuth subsalicylate (PEPTO BISMOL) 262 MG chewable tablet Chew 2  tablets (524 mg total) by mouth as needed. 11/24/24  Yes Elnor Savant A, DO  dicyclomine (BENTYL) 20 MG tablet Take 1 tablet (20 mg total) by mouth 2 (two) times daily. 11/24/24  Yes Elnor Savant A, DO  oxyCODONE (ROXICODONE) 5 MG immediate release tablet Take 1 tablet (5 mg total) by mouth every 4 (four) hours as needed for moderate pain (pain score 4-6). 11/24/24  Yes Elnor Savant A, DO  albuterol (PROVENTIL HFA;VENTOLIN HFA) 108 (90 BASE) MCG/ACT inhaler Inhale 2 puffs into the lungs every 4 (four) hours as needed for wheezing or shortness of breath.    [provider]  betamethasone  valerate (VALISONE ) 0.1 % cream Apply topically as needed. 07/08/23   [provider]  bimekizumab-bkzx [facility]) 160 MG/ML prefilled syringe Inject 160 mg into the skin every 28 (twenty-eight) days.    [provider]  budesonide-formoterol (SYMBICORT) 160-4.5 MCG/ACT inhaler Inhale 2 puffs into the lungs as needed. 06/11/24 06/11/25  [provider]  Cholecalciferol (VITAMIN D) 2000 UNITS CAPS Take 1 capsule by mouth daily.    [provider]  clobetasol ointment (TEMOVATE) 0.05 % Apply 1 Application topically as needed. 01/23/24   [provider]  estradiol (ESTRACE) 1 MG tablet Take 1 mg by mouth daily.    [provider]  famotidine  (PEPCID ) 20 MG tablet Take 1 tablet (20 mg total) by mouth daily. 12/16/23   [name], PA  folic acid (FOLVITE) 1 MG tablet Take 1 mg by mouth daily. Patient takes 2 tablets daily  [provider]  lisinopril-hydrochlorothiazide (ZESTORETIC) 20-25 MG tablet Take 1 tablet by mouth daily. 05/04/21   [provider]  melatonin 5 MG TABS Take 5 mg by mouth at bedtime as needed (sleep).    [provider]  methocarbamol (ROBAXIN) 500 MG tablet Take 500 mg by mouth daily. Patient taking differently: Take 500 mg by mouth daily as needed for muscle spasms. 02/19/24   [provider]   MOUNJARO 5 MG/0.5ML Pen Inject 5 mg into the skin once a week. 06/26/24   [provider]  naproxen (NAPROSYN) 500 MG tablet Take 500 mg by mouth 2 (two) times daily as needed.    [provider]  Omega-3 Fatty Acids (FT FISH OIL) 650 MG CPDR Take 1 capsule by mouth daily.    [provider]  omeprazole  (PRILOSEC) 20 MG capsule Take 20 mg by mouth daily. Patient taking differently: Take 40 mg by mouth daily.    [provider]  pimecrolimus (ELIDEL) 1 % cream Apply 1 Application topically daily as needed (itching).    [provider]  pravastatin (PRAVACHOL) 10 MG tablet Take 10 mg by mouth at bedtime. 07/03/24   [provider]  predniSONE (DELTASONE) 1 MG tablet Take 1 mg by mouth daily. 01/26/24   [provider]  traZODone (DESYREL) 50 MG tablet Take 50 mg by mouth at bedtime. Patient taking differently: Take 50 mg by mouth at bedtime as needed for sleep. 06/11/24   [provider]  XDEMVY 0.25 % SOLN Place 1 drop into both eyes in the morning and at bedtime. 09/24/24   [provider]                                                                                                                                    Past Surgical History Past Surgical History:  Procedure Laterality Date   BLADDER SURGERY     laparoscopy 1984     VAGINAL HYSTERECTOMY     Family History Family History  Problem Relation Age of Onset   Diabetes Mother    Hypertension Mother    Lung cancer Father        PTSD   Multiple sclerosis Maternal Aunt        no family hx   Glaucoma Maternal Grandmother    Breast cancer Cousin 51   Cancer Other        1st cousin, Breast CA, deceased at 28y   Colon cancer Neg Hx    Esophageal cancer Neg Hx    Rectal cancer Neg Hx    Stomach cancer Neg Hx     Social History Social History   Tobacco Use   Smoking status: Never   Smokeless tobacco: Never  Vaping Use   Vaping status: Never Used   Substance Use Topics   Alcohol use: No    Alcohol/week: 0.0 standard drinks of alcohol   Drug  use: No   Allergies Meperidine hcl, Nystatin , and Other  Review of Systems A thorough review of systems was obtained and all systems are negative except as noted in the HPI and PMH.   Physical Exam Vital Signs  I have reviewed the triage vital signs BP 94/60 (BP Location: Left Arm)   Pulse 77   Temp 98.3 F (36.8 C) (Oral)   Resp 18   Ht 5' 3 (1.6 m)   Wt 79.4 kg   SpO2 100%   BMI 31.00 kg/m  Physical Exam Vitals and nursing note reviewed.  Constitutional:      General: She is not in acute distress.    Appearance: Normal appearance. She is obese.  HENT:     Head: Normocephalic and atraumatic.     Right Ear: External ear normal.     Left Ear: External ear normal.     Nose: Nose normal.     Mouth/Throat:     Mouth: Mucous membranes are moist.  Eyes:     General: No scleral icterus.       Right eye: No discharge.        Left eye: No discharge.  Cardiovascular:     Rate and Rhythm: Normal rate.     Pulses: Normal pulses.  Pulmonary:     Effort: Pulmonary effort is normal. No respiratory distress.     Breath sounds: Normal breath sounds. No stridor.  Abdominal:     General: Abdomen is flat. There is no distension.     Palpations: Abdomen is soft.     Tenderness: There is abdominal tenderness in the periumbilical area, suprapubic area and left lower quadrant. There is guarding. There is no rebound.  Musculoskeletal:     Cervical back: No rigidity.     Right lower leg: No edema.     Left lower leg: No edema.  Skin:    General: Skin is warm and dry.     Capillary Refill: Capillary refill takes less than 2 seconds.  Neurological:     Mental Status: She is alert.  Psychiatric:        Mood and Affect: Mood normal.        Behavior: Behavior normal. Behavior is cooperative.     ED Results and Treatments Labs (all labs ordered are listed, but only abnormal results are  displayed) Labs Reviewed  CBC WITH DIFFERENTIAL/PLATELET - Abnormal; Notable for the following components:      Result Value   WBC 12.3 (*)    Neutro Abs 10.4 (*)    All other components within normal limits  COMPREHENSIVE METABOLIC PANEL WITH GFR - Abnormal; Notable for the following components:   Glucose, Bld 138 (*)    All other components within normal limits  URINALYSIS, ROUTINE W REFLEX MICROSCOPIC - Abnormal; Notable for the following components:   Hgb urine dipstick MODERATE (*)    Ketones, ur 15 (*)    All other components within normal limits  URINALYSIS, MICROSCOPIC (REFLEX) - Abnormal; Notable for the following components:   Bacteria, UA MANY (*)    All other components within normal limits  LIPASE, BLOOD  Radiology CT ABDOMEN PELVIS W CONTRAST Result Date: 11/24/2024 CLINICAL DATA:  Left lower quadrant pain and paraumbilical pain. Constipation. EXAM: CT ABDOMEN AND PELVIS WITH CONTRAST TECHNIQUE: Multidetector CT imaging of the abdomen and pelvis was performed using the standard protocol following bolus administration of intravenous contrast. RADIATION DOSE REDUCTION: This exam was performed according to the departmental dose-optimization program which includes automated exposure control, adjustment of the mA and/or kV according to patient size and/or use of iterative reconstruction technique. CONTRAST:  80mL OMNIPAQUE  IOHEXOL  300 MG/ML  SOLN COMPARISON:  01/15/2005 FINDINGS: Lower Chest: No acute findings. Hepatobiliary: No suspicious hepatic masses identified. Gallbladder is unremarkable. No evidence of biliary ductal dilatation. Pancreas:  No mass or inflammatory changes. Spleen: Within normal limits in size and appearance. Adrenals/Urinary Tract: No suspicious masses identified. No evidence of ureteral calculi or hydronephrosis. Stomach/Bowel: Mild colonic  wall thickening and pericolonic soft tissue stranding is seen involving the descending and proximal sigmoid colon, without No evidence of diverticular disease. This is consistent with colitis. No No evidence of bowel obstruction, perforation, or abscess. Vascular/Lymphatic: No pathologically enlarged lymph nodes. No acute vascular findings. Reproductive: Prior hysterectomy noted. Adnexal regions are unremarkable in appearance. Other:  None. Musculoskeletal:  No suspicious bone lesions identified. IMPRESSION: Mild colitis throughout the descending and proximal sigmoid colon. Electronically Signed   By: Norleen DELENA Kil M.D.   On: 11/24/2024 08:57    Pertinent labs & imaging results that were available during my care of the patient were reviewed by me and considered in my medical decision making (see MDM for details).  Medications Ordered in ED Medications  morphine (PF) 4 MG/ML injection 4 mg (4 mg Intravenous Given 11/24/24 0749)  ondansetron (ZOFRAN) injection 4 mg (4 mg Intravenous Given 11/24/24 0748)  sodium chloride  0.9 % bolus 1,000 mL (0 mLs Intravenous Stopped 11/24/24 0914)  iohexol  (OMNIPAQUE ) 300 MG/ML solution 100 mL (80 mLs Intravenous Contrast Given 11/24/24 0813)  dicyclomine (BENTYL) capsule 10 mg (10 mg Oral Given 11/24/24 0945)  ketorolac (TORADOL) 15 MG/ML injection 15 mg (15 mg Intravenous Given 11/24/24 0946)                                                                                                                                     Procedures Procedures  (including critical care time)  Medical Decision Making / ED Course    Medical Decision Making:    Kyung Muto is a 53 y.o. female with past medical history as below, significant for DM, constipation, hypertension, IBS, obesity who presents to the ED with complaint of abdominal pain, constipation. The complaint involves an extensive differential diagnosis and also carries with it a high risk of complications  and morbidity.  Serious etiology was considered. Ddx includes but is not limited to: Differential diagnosis includes but is not exclusive toovarian cyst, ovarian torsion, acute appendicitis, urinary tract infection, endometriosis, bowel obstruction, hernia, colitis, renal colic, gastroenteritis, volvulus etc.  Complete initial physical exam performed, notably the patient was in no acute distress, appears uncomfortable.    Reviewed and confirmed nursing documentation for past medical history, family history, social history.  Vital signs reviewed.    Lower quadrant abd pain Constipation> - Abdomen soft, nonperitoneal but is quite tender left lower quadrant and suprapubic/periumbilical area.  She is HDS.  Obtain abdominal pain focused labs, CT imaging, provide analgesia and fluids. - Labs are stable, CT concerning for mild colitis - She is feeling much better, she is tolerant p.o. difficulty. - Symptoms likely secondary to colitis. No fever  Clinical Course as of 11/24/24 1111  Sat Nov 24, 2024  0958 Pain improving [SG]  1039 Feeling much better, tolerating p.o. [SG]    Clinical Course User Index [SG] [name], DO      I have discussed the diagnosis/risks/treatment options with the patient and family.  Evaluation and diagnostic testing in the emergency department does not suggest an emergent condition requiring admission or immediate intervention beyond what has been performed at this time.  They will follow up with pcp. We also discussed returning to the ED immediately if new or worsening sx occur. We discussed the sx which are most concerning (e.g., sudden worsening pain, fever, inability to tolerate by mouth) that necessitate immediate return.    The patient appears reasonably screened and/or stabilized for discharge and I doubt any other medical condition or other [facility] requiring further screening, evaluation, or treatment in the ED at this time prior to discharge.                  Additional history obtained: -Additional history obtained from family -External records from outside source obtained and reviewed including: Chart review including previous notes, labs, imaging, consultation notes including  Prior primary care documentation Allergies   Lab Tests: -I ordered, reviewed, and interpreted labs.   The pertinent results include:   Labs Reviewed  CBC WITH DIFFERENTIAL/PLATELET - Abnormal; Notable for the following components:      Result Value   WBC 12.3 (*)    Neutro Abs 10.4 (*)    All other components within normal limits  COMPREHENSIVE METABOLIC PANEL WITH GFR - Abnormal; Notable for the following components:   Glucose, Bld 138 (*)    All other components within normal limits  URINALYSIS, ROUTINE W REFLEX MICROSCOPIC - Abnormal; Notable for the following components:   Hgb urine dipstick MODERATE (*)    Ketones, ur 15 (*)    All other components within normal limits  URINALYSIS, MICROSCOPIC (REFLEX) - Abnormal; Notable for the following components:   Bacteria, UA MANY (*)    All other components within normal limits  LIPASE, BLOOD    Notable for labs are stable  EKG   EKG Interpretation Date/Time:    Ventricular Rate:    PR Interval:    QRS Duration:    QT Interval:    QTC Calculation:   R Axis:      Text Interpretation:           Imaging Studies ordered: I ordered imaging studies including CTAP I independently visualized the following imaging with scope of interpretation limited to determining acute life threatening conditions related to emergency care; findings noted above I agree with the radiologist interpretation If any imaging was obtained with contrast I closely monitored patient for any possible adverse reaction a/w contrast administration in the emergency department   Medicines ordered and prescription drug management: Meds ordered this encounter  Medications   morphine (PF) 4 MG/ML  injection 4 mg   ondansetron (ZOFRAN) injection 4 mg   sodium chloride  0.9 % bolus 1,000 mL   iohexol  (OMNIPAQUE ) 300 MG/ML solution 100 mL   dicyclomine (BENTYL) capsule 10 mg   ketorolac (TORADOL) 15 MG/ML injection 15 mg   oxyCODONE (ROXICODONE) 5 MG immediate release tablet    Sig: Take 1 tablet (5 mg total) by mouth every 4 (four) hours as needed for moderate pain (pain score 4-6).    Dispense:  5 tablet    Refill:  0   dicyclomine (BENTYL) 20 MG tablet    Sig: Take 1 tablet (20 mg total) by mouth 2 (two) times daily.    Dispense:  20 tablet    Refill:  0   bismuth subsalicylate (PEPTO BISMOL) 262 MG chewable tablet    Sig: Chew 2 tablets (524 mg total) by mouth as needed.    Dispense:  30 tablet    Refill:  0    -I have reviewed the patients home medicines and have made adjustments as needed   Consultations Obtained: Not applicable  Cardiac Monitoring: Continuous pulse oximetry interpreted by myself, 100% on RA.    Social Determinants of Health:  Diagnosis or treatment significantly limited by social determinants of health: obesity   Reevaluation: After the interventions noted above, I reevaluated the patient and found that they have improved  Co morbidities that complicate the patient evaluation  Past Medical History:  Diagnosis Date   ARTHRITIS    Arthritis    Asthma    ASTHMA, UNSPECIFIED    Carpal tunnel syndrome    CONSTIPATION    Diabetes mellitus without complication (HCC)    FATTY LIVER DISEASE    Fibromyalgia    GERD    HYPERTENSION    Irritable bowel syndrome    NEPHROLITHIASIS, HX OF    OBESITY    Psoriasis       Dispostion: Disposition decision including need for hospitalization was considered, and patient discharged from emergency department.    Final Clinical Impression(s) / ED Diagnoses Final diagnoses:  Generalized abdominal pain  Colitis        [name], DO 11/24/24 1111

## 2024-11-24 NOTE — Discharge Instructions (Addendum)
 It was a pleasure caring for you today in the emergency department.  Be sure to drink plenty of fluids, get plenty of rest over the next few days.  Please follow-up with your PCP for recheck.  You should return to the hospital if you experience return of persistent nausea and vomiting that does not resolve and does not allow you to tolerate any food or fluids, persistent fevers for greater than 2-3 more days, increasing abdominal pain that persists despite medications, persistent diarrhea, dizziness, syncope (fainting), or for any other concerns.    Please return to the emergency department immediately for any new or concerning symptoms, or if you get worse.

## 2024-11-28 ENCOUNTER — Encounter: Payer: Self-pay | Admitting: Gastroenterology

## 2024-11-28 ENCOUNTER — Other Ambulatory Visit

## 2024-11-28 ENCOUNTER — Ambulatory Visit: Admitting: Gastroenterology

## 2024-11-28 ENCOUNTER — Telehealth: Payer: Self-pay

## 2024-11-28 VITALS — BP 110/78 | HR 69 | Ht 63.0 in | Wt 174.0 lb

## 2024-11-28 DIAGNOSIS — K5909 Other constipation: Secondary | ICD-10-CM

## 2024-11-28 DIAGNOSIS — R1032 Left lower quadrant pain: Secondary | ICD-10-CM

## 2024-11-28 DIAGNOSIS — K529 Noninfective gastroenteritis and colitis, unspecified: Secondary | ICD-10-CM | POA: Diagnosis not present

## 2024-11-28 DIAGNOSIS — K760 Fatty (change of) liver, not elsewhere classified: Secondary | ICD-10-CM

## 2024-11-28 DIAGNOSIS — K625 Hemorrhage of anus and rectum: Secondary | ICD-10-CM | POA: Diagnosis not present

## 2024-11-28 LAB — COMPREHENSIVE METABOLIC PANEL WITH GFR
ALT: 14 U/L (ref 0–35)
AST: 11 U/L (ref 0–37)
Albumin: 4.5 g/dL (ref 3.5–5.2)
Alkaline Phosphatase: 46 U/L (ref 39–117)
BUN: 10 mg/dL (ref 6–23)
CO2: 29 meq/L (ref 19–32)
Calcium: 9.9 mg/dL (ref 8.4–10.5)
Chloride: 97 meq/L (ref 96–112)
Creatinine, Ser: 0.69 mg/dL (ref 0.40–1.20)
GFR: 98.31 mL/min (ref >60.00)
Glucose, Bld: 95 mg/dL (ref 70–99)
Potassium: 3.8 meq/L (ref 3.5–5.1)
Sodium: 136 meq/L (ref 135–145)
Total Bilirubin: 0.3 mg/dL (ref 0.2–1.2)
Total Protein: 7.7 g/dL (ref 6.0–8.3)

## 2024-11-28 LAB — CBC WITH DIFFERENTIAL/PLATELET
Basophils Absolute: 0.1 K/uL (ref 0.0–0.1)
Basophils Relative: 0.6 % (ref 0.0–3.0)
Eosinophils Absolute: 0.2 K/uL (ref 0.0–0.7)
Eosinophils Relative: 2.5 % (ref 0.0–5.0)
HCT: 40.5 % (ref 36.0–46.0)
Hemoglobin: 13.5 g/dL (ref 12.0–15.0)
Lymphocytes Relative: 33.7 % (ref 12.0–46.0)
Lymphs Abs: 2.9 K/uL (ref 0.7–4.0)
MCHC: 33.3 g/dL (ref 30.0–36.0)
MCV: 92.1 fl (ref 78.0–100.0)
Monocytes Absolute: 0.6 K/uL (ref 0.1–1.0)
Monocytes Relative: 7 % (ref 3.0–12.0)
Neutro Abs: 4.9 K/uL (ref 1.4–7.7)
Neutrophils Relative %: 56.2 % (ref 43.0–77.0)
Platelets: 369 K/uL (ref 150.0–400.0)
RBC: 4.4 Mil/uL (ref 3.87–5.11)
RDW: 12.8 % (ref 11.5–15.5)
WBC: 8.7 K/uL (ref 4.0–10.5)

## 2024-11-28 LAB — SEDIMENTATION RATE: Sed Rate: 41 mm/h — ABNORMAL HIGH (ref 0–30)

## 2024-11-28 LAB — C-REACTIVE PROTEIN: CRP: 0.6 mg/dL (ref 0.5–20.0)

## 2024-11-28 MED ORDER — NA SULFATE-K SULFATE-MG SULF 17.5-3.13-1.6 GM/177ML PO SOLN: 1.0000 | Freq: Once | ORAL | 0 refills | Status: AC

## 2024-11-28 NOTE — Telephone Encounter (Signed)
  Brock Mokry 04-Jul-1971 [id number]  11/28/24   Dear Lonni LITTIE Nanas, MD:  We have scheduled the above named patient for a(n) endoscopic procedure. Our records show that (s)he is on anticoagulation therapy.  Please advise as to whether the patient has cardiac clearance prior to their procedure which is scheduled for 01/10/25.  Please route your response to Karna Vicci KRAFT or fax response to [phone].  Sincerely,    [facility]

## 2024-11-28 NOTE — Progress Notes (Signed)
 Chief Complaint:ED follow-up, colitis Primary GI Doctor: Dr. Federico  HPI:  Patient is a  53  year old female patient with past medical history of DM, constipation, hypertension, IBS, hysterectomy, obesity, who was self referred to me for a evaluation after recent ED visit for LLQ pain, constipation.  10/23/24 cardiology appt, patient reported heart palpitations. Dr Kate ordered a zio monitor for 2 weeks. She has not received results yet.  11/24/14 seen in ED for LLQ pain, constipation. Labs show: WBC 12.3, Hgb 13.8, PLT 342, normal LFTs, lipase 19,  CT scan showed mild colitis throughout the descending and proximal sigmoid colon.   Interval History Patient last seen in the GI office on 12/16/2023 by Delon, [place] for follow-up on GERD  Patient presents for follow-up after recent ED visit. She reports prior to ED visit she went about 7-8 days without a BM. She was taking OTC stool softeners and laxatives prn without any result. She woke up on following Monday morning at 3 am with severe abdominal pain, had one BM. She started to cramping again and passed BRBPR that felt like diarrhea but just blood. She started to feel extremely fatigued. She ended up going to ED due to severity of pain.   Patient is on Mounjaro amongst few other drugs she feels exacerbate the constipation.  She has tried OTC Miralax and MOM in past and they caused severe abd cramping.  She also has Psoriatic arthritis and taking Bimzelx.  She had bowel movement this am. She reports energy has improved some.   She has a Aunt with Ulcerative colitis.   Wt Readings from Last 3 Encounters:  11/28/24 174 lb (78.9 kg)  11/24/24 175 lb (79.4 kg)  10/23/24 175 lb 3.2 oz (79.5 kg)    Past Medical History:  Diagnosis Date   ARTHRITIS    Arthritis    Asthma    ASTHMA, UNSPECIFIED    Carpal tunnel syndrome    CONSTIPATION    Diabetes mellitus without complication (HCC)    FATTY LIVER DISEASE    Fibromyalgia     GERD    HYPERTENSION    Irritable bowel syndrome    NEPHROLITHIASIS, HX OF    OBESITY    Psoriasis     Past Surgical History:  Procedure Laterality Date   BLADDER SURGERY     laparoscopy 1984     VAGINAL HYSTERECTOMY      Current Outpatient Medications  Medication Sig Dispense Refill   albuterol (PROVENTIL HFA;VENTOLIN HFA) 108 (90 BASE) MCG/ACT inhaler Inhale 2 puffs into the lungs every 4 (four) hours as needed for wheezing or shortness of breath.     betamethasone  valerate (VALISONE ) 0.1 % cream Apply topically as needed.     bimekizumab-bkzx (BIMZELX) 160 MG/ML prefilled syringe Inject 160 mg into the skin every 28 (twenty-eight) days.     bismuth subsalicylate (PEPTO BISMOL) 262 MG chewable tablet Chew 2 tablets (524 mg total) by mouth as needed. 30 tablet 0   budesonide-formoterol (SYMBICORT) 160-4.5 MCG/ACT inhaler Inhale 2 puffs into the lungs as needed.     Cholecalciferol (VITAMIN D) 2000 UNITS CAPS Take 1 capsule by mouth daily.     clobetasol ointment (TEMOVATE) 0.05 % Apply 1 Application topically as needed.     dicyclomine (BENTYL) 20 MG tablet Take 1 tablet (20 mg total) by mouth 2 (two) times daily. 20 tablet 0   estradiol (ESTRACE) 1 MG tablet Take 1 mg by mouth daily.     famotidine  (  PEPCID ) 20 MG tablet Take 1 tablet (20 mg total) by mouth daily. 90 tablet 3   folic acid (FOLVITE) 1 MG tablet Take 1 mg by mouth daily. Patient takes 2 tablets daily     lisinopril-hydrochlorothiazide (ZESTORETIC) 20-25 MG tablet Take 1 tablet by mouth daily.     melatonin 5 MG TABS Take 5 mg by mouth at bedtime as needed (sleep).     methocarbamol (ROBAXIN) 500 MG tablet Take 500 mg by mouth daily. (Patient taking differently: Take 500 mg by mouth daily as needed for muscle spasms.)     MOUNJARO 5 MG/0.5ML Pen Inject 5 mg into the skin once a week.     naproxen (NAPROSYN) 500 MG tablet Take 500 mg by mouth 2 (two) times daily as needed.     Omega-3 Fatty Acids (FT FISH OIL) 650 MG  CPDR Take 1 capsule by mouth daily.     omeprazole  (PRILOSEC) 20 MG capsule Take 20 mg by mouth daily. (Patient taking differently: Take 40 mg by mouth daily.)     oxyCODONE (ROXICODONE) 5 MG immediate release tablet Take 1 tablet (5 mg total) by mouth every 4 (four) hours as needed for moderate pain (pain score 4-6). 5 tablet 0   pimecrolimus (ELIDEL) 1 % cream Apply 1 Application topically daily as needed (itching).     pravastatin (PRAVACHOL) 10 MG tablet Take 10 mg by mouth at bedtime.     predniSONE (DELTASONE) 1 MG tablet Take 1 mg by mouth daily.     traZODone (DESYREL) 50 MG tablet Take 50 mg by mouth at bedtime. (Patient taking differently: Take 50 mg by mouth at bedtime as needed for sleep.)     XDEMVY 0.25 % SOLN Place 1 drop into both eyes in the morning and at bedtime.     No current facility-administered medications for this visit.    Allergies as of 11/28/2024 - Review Complete 11/28/2024  Allergen Reaction Noted   Meperidine hcl  01/07/2011   Nystatin   01/07/2011   Other  11/15/2014    Family History  Problem Relation Age of Onset   Diabetes Mother    Hypertension Mother    Lung cancer Father        PTSD   Multiple sclerosis Maternal Aunt        no family hx   Glaucoma Maternal Grandmother    Breast cancer Cousin 11   Cancer Other        1st cousin, Breast CA, deceased at 26y   Colon cancer Neg Hx    Esophageal cancer Neg Hx    Rectal cancer Neg Hx    Stomach cancer Neg Hx     Review of Systems:    Constitutional: No weight loss, fever, chills, weakness or fatigue HEENT: Eyes: No change in vision               Ears, Nose, Throat:  No change in hearing or congestion Skin: No rash or itching Cardiovascular: No chest pain, chest pressure or palpitations   Respiratory: No SOB or cough Gastrointestinal: See HPI and otherwise negative Genitourinary: No dysuria or change in urinary frequency Neurological: No headache, dizziness or syncope Musculoskeletal: No  new muscle or joint pain Hematologic: No bleeding or bruising Psychiatric: No history of depression or anxiety    Physical Exam:  Vital signs: BP 110/78   Pulse 69   Ht 5' 3 (1.6 m)   Wt 174 lb (78.9 kg)   BMI 30.82 kg/m  Constitutional:   Pleasant female appears to be in NAD, Well developed, Well nourished, alert and cooperative Eyes:   PEERL, EOMI. No icterus. Conjunctiva pink. Neck:  Supple Throat: Oral cavity and pharynx without inflammation, swelling or lesion.  Respiratory: Respirations even and unlabored. Lungs clear to auscultation bilaterally.   No wheezes, crackles, or rhonchi.  Cardiovascular: Normal S1, S2. Regular rate and rhythm. No peripheral edema, cyanosis or pallor.  Gastrointestinal:  Soft, nondistended, LLQ tenderness with palpation. No rebound or guarding. Normal bowel sounds. No appreciable masses or hepatomegaly. Rectal:  Not performed.  Msk:  Symmetrical without gross deformities. Without edema, no deformity or joint abnormality.  Neurologic:  Alert and  oriented x4;  grossly normal neurologically.  Skin:   Dry and intact without significant lesions or rashes.  RELEVANT LABS AND IMAGING: CBC    Latest Ref Rng & Units 11/24/2024    7:35 AM 07/10/2024   11:39 AM 01/14/2014    4:20 AM  CBC  WBC 4.0 - 10.5 K/uL 12.3  7.4  10.2   Hemoglobin 12.0 - 15.0 g/dL 86.1  85.7  87.2   Hematocrit 36.0 - 46.0 % 40.5  42.1  37.8   Platelets 150 - 400 K/uL 342  344.0  335      CMP     Latest Ref Rng & Units 11/24/2024    7:35 AM 07/10/2024   11:39 AM 06/18/2021   12:32 PM  CMP  Glucose 70 - 99 mg/dL 861   79   BUN 6 - 20 mg/dL 12   8   Creatinine 9.55 - 1.00 mg/dL 9.36   9.31   Sodium 864 - 145 mmol/L 135   139   Potassium 3.5 - 5.1 mmol/L 4.0   4.4   Chloride 98 - 111 mmol/L 98   99   CO2 22 - 32 mmol/L 22   24   Calcium 8.9 - 10.3 mg/dL 9.5   9.8   Total Protein 6.5 - 8.1 g/dL 7.1  7.5    Total Bilirubin 0.0 - 1.2 mg/dL 0.3  0.4    Alkaline Phos 38 - 126  U/L 54  47    AST 15 - 41 U/L 28  24    ALT 0 - 44 U/L 19  40    08/2024 labs show: CRP 2, Sed rate 35, vitamin b 12 528 , AST 18, ALT 22    07/01/2020 colonoscopy for screening with normal colon and a small amount of deflated external hemorrhoid tissue. Repeat recommended in 10 years.   03/31/2022 EGD Dr. Teressa with mild nonspecific distal gastritis and a few small typical appearing fundic gland polyps. At that time noted to have nonerosive GERD. Pathology showed mild chronic gastritis.   Imaging: 11/24/24 CTAP IMPRESSION: Mild colitis throughout the descending and proximal sigmoid colon.    Assessment/Plan: Encounter Diagnoses  Name Primary?   LLQ pain Yes   Nonspecific colitis    Rectal bleeding    Chronic constipation    Fatty liver     53 year old female patient with history of psoriatic arthritis and chronic constipation who presents for follow-up after recent ED visit for left lower quadrant pain, constipation, and rectal bleeding.  CT scan showed mild colitis throughout the descending and proximal sigmoid colon.  It was suspected to be possible viral or bacterial infection.  Patient denies any exposure or recent illness.  Patient did have bout of severe constipation prior to ED visit.  Patient is on a few  medications that could potentially decrease motility and exacerbate her constipation creasing the risk of ischemic colitis.  Will go ahead and provide the patient with samples of Linzess pro secretory agent 72 mcg p.o. daily.  Will also go ahead and recheck her lab work today to reevaluate her white blood count and hemoglobin.  Would also like to check inflammatory marker CRP and sed rate.  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  Will provide the patient with bowel rest for about 6 weeks and schedule follow-up colonoscopy to reevaluate and take biopsies with Dr. Federico in [facility].     Patient has  also been worked up for fatty liver in the past, some changes were made to her medication including her statin pravastatin was reduced from 20 mg to 10 mg daily. Leflunomide is also on pause due to her elevated LFTs.  Her most recent LFTs were normal.  Liver elastography 06/2024 did not show any signs of advanced liver disease. ANA positive. Autoimmune labs for hepatitis were negative. Labs negative for viral hepatitis.   #1 LLQ pain #2 rectal bleeding #3 Colitis, on CTAP 11/25 Mild colitis throughout the descending and proximal sigmoid colon. -CBC, CMP, CRP, Sed rate -Schedule for a colonoscopy in [facility] with Dr. Federico. The risks and benefits of colonoscopy with possible polypectomy / biopsies were discussed and the patient agrees to proceed.  -cardiac clearance needed, recently had Zio monitor, hasn't received results.  #4 Chronic constipation -linzess 72mcg po daily samples #5 Fatty liver- LFT's back to normal , US  elastography 7/25 normal. CTAP 11/25 with no suspicious hepatic masses identified.  - Hep A and B vaccinations recommended, will revisit at next follow-up   Thank you for the courtesy of this consult. Please call me with any questions or concerns.   Savilla Turbyfill, FNP-C [facility] 11/28/2024, 3:52 PM  Cc: Rankins, Richerd SAUNDERS, MD

## 2024-11-28 NOTE — Patient Instructions (Addendum)
 Constipation Drink plenty of fluids Linzess 72 mcg po daily, take 1 tablet 30-45 minutes before first meal of day If it works well, please message me Mychart for prescription  Advised to go to the ER if there is any severe abdominal pain, unable to hold down food/water, blood in stool or vomit, chest pain, shortness of breath, or any worsening symptoms.    Take a laxative for one week prior to procedure date to make sure colon is properly cleaned.  You have been scheduled for a colonoscopy. Please follow written instructions given to you at your visit today.   If you use inhalers (even only as needed), please bring them with you on the day of your procedure.  DO NOT TAKE 7 DAYS PRIOR TO TEST- Trulicity (dulaglutide) Ozempic, Wegovy (semaglutide) Mounjaro, Zepbound (tirzepatide) Bydureon Bcise (exanatide extended release)  DO NOT TAKE 1 DAY PRIOR TO YOUR TEST Rybelsus (semaglutide) Adlyxin (lixisenatide) Victoza (liraglutide) Byetta (exanatide) ___________________________________________________________________________  Due to recent changes in healthcare laws, you may see the results of your imaging and laboratory studies on MyChart before your provider has had a chance to review them.  We understand that in some cases there may be results that are confusing or concerning to you. Not all laboratory results come back in the same time frame and the provider may be waiting for multiple results in order to interpret others.  Please give us  48 hours in order for your provider to thoroughly review all the results before contacting the office for clarification of your results.   Thank you for trusting me with your gastrointestinal care. Deanna May, FNP-C

## 2024-11-29 ENCOUNTER — Ambulatory Visit: Payer: Self-pay | Admitting: Gastroenterology

## 2024-12-03 ENCOUNTER — Encounter: Payer: Self-pay | Admitting: Cardiology

## 2024-12-04 ENCOUNTER — Other Ambulatory Visit: Payer: Self-pay | Admitting: Gastroenterology

## 2024-12-04 DIAGNOSIS — K5909 Other constipation: Secondary | ICD-10-CM

## 2024-12-04 NOTE — Progress Notes (Signed)
 error

## 2024-12-06 ENCOUNTER — Other Ambulatory Visit: Payer: Self-pay | Admitting: Medical Genetics

## 2024-12-06 MED ORDER — LINACLOTIDE 145 MCG PO CAPS: 145.0000 ug | ORAL_CAPSULE | Freq: Every day | ORAL | 3 refills | Status: AC

## 2024-12-10 NOTE — Telephone Encounter (Addendum)
 I sent a secure chat the Karna Louder, CMA:   Me: good morning Karna. I saw your notes about cardiac clearance only. the preop team will need to know what the procedure is as well as if any anesthesia being used and if so what type. I see you noted endoscopic , cardiologist wants to confirm the pt is having an EGD?

## 2024-12-10 NOTE — Telephone Encounter (Signed)
 Secure chat continued.  Karna, CMA: in her chart on the appt desk it states its a colonoscopy with propofol   Me: thank you, but we do not look at the appt desk top. we rely on the request from requesting office to provide the information. I appreciate your help

## 2024-12-10 NOTE — Telephone Encounter (Signed)
° °  Name: Andrea Rhodes  DOB: June 18, 1971  MRN: [id number]  Primary Cardiologist: Lonni LITTIE Nanas, MD   Preoperative team, please contact this patient and set up a phone call appointment for further preoperative risk assessment. Please obtain consent and complete medication review. Thank you for your help.  I confirm that guidance regarding antiplatelet and oral anticoagulation therapy has been completed and, if necessary, noted below.  Request notes on 11/28/2024 indicated that GI states Our records show that (s)he is on anticoagulation therapy.  On review of her medication list in Epic, I do not see that she is on anticoagulant or antiplatelet therapy.   I also confirmed the patient resides in the state of  . As per [facility] Medical Board telemedicine laws, the patient must reside in the state in which the provider is licensed.   Lamarr Satterfield, NP 12/10/2024, 11:34 AM [facility]

## 2024-12-10 NOTE — Telephone Encounter (Signed)
 I s/w the pt about tele preop appt. She said she does not even know why she needs any kind of appt for the preop clearance. Pt tells me that she recently wore a heart monitor, which she said GI MD  told her that she will reach out to cardiology for preop clearance.  Pt tells me that she has so much going on right now and she literally does not have time for an appt. I again said this is just a 10-15 minutes phone call. Pt answered yes she does not even have tome for the tele visit as well. Pt said she is going to s/w GI office as to if this is really all necessary and why is she needing cardiac clearance.   Pt said she will call us  back if need to still schedule tele preop appt.   In the meantime I will remove from the preop call back pool at this time.

## 2024-12-11 ENCOUNTER — Telehealth (HOSPITAL_BASED_OUTPATIENT_CLINIC_OR_DEPARTMENT_OTHER): Payer: Self-pay | Admitting: *Deleted

## 2024-12-11 ENCOUNTER — Ambulatory Visit: Payer: Self-pay | Admitting: Cardiology

## 2024-12-11 DIAGNOSIS — R002 Palpitations: Secondary | ICD-10-CM

## 2024-12-11 NOTE — Telephone Encounter (Signed)
 I was sent a secure today from Damien ORN. RN with the GI office who was following up on clearance. I informed RN that I s/w the pt yesterday; pt was upset why did she need clearance. I tried to explain to her due the heart monitor. Pt told me yesterday that she will d/w the GI MD and if she still needs clearance she will call us  back.   In my chat with RN, Damien: ok on 12/03 Andrea May, NP requested cardiac clearance due to patient having to have cardiac monitoring done via Zio patch. We will still need this clearance. Ill message the patient and tell her . Do I need to re-send the request for clearance?   Me: no need to resend the request. I will get back in process on our end.   RN: I messaged patient and re-explained why it was necessary and she Rhodes have to do tele visit with your office to movie forward with colonoscopy.    I will put this back into preop pool and preop call back, we will call the pt to schedule a tele preop appt.

## 2024-12-11 NOTE — Telephone Encounter (Signed)
 Pt has been scheduled tele preop appt 12/26/24. Med rec and consent are done.

## 2024-12-11 NOTE — Telephone Encounter (Signed)
 Pt has been scheduled tele preop appt 12/26/24. Med rec and consent are done.     Patient Consent for Virtual Visit        Andrea Rhodes has provided verbal consent on 12/11/2024 for a virtual visit (video or telephone).   CONSENT FOR VIRTUAL VISIT FOR:  Andrea Rhodes  By participating in this virtual visit I agree to the following:  I hereby voluntarily request, consent and authorize [facility] and its employed or contracted physicians, physician assistants, nurse practitioners or other licensed health care professionals (the Practitioner), to provide me with telemedicine health care services (the Services) as deemed necessary by the treating Practitioner. I acknowledge and consent to receive the Services by the Practitioner via telemedicine. I understand that the telemedicine visit will involve communicating with the Practitioner through live audiovisual communication technology and the disclosure of certain medical information by electronic transmission. I acknowledge that I have been given the opportunity to request an in-person assessment or other available alternative prior to the telemedicine visit and am voluntarily participating in the telemedicine visit.  I understand that I have the right to withhold or withdraw my consent to the use of telemedicine in the course of my care at any time, without affecting my right to future care or treatment, and that the Practitioner or I may terminate the telemedicine visit at any time. I understand that I have the right to inspect all information obtained and/or recorded in the course of the telemedicine visit and may receive copies of available information for a reasonable fee.  I understand that some of the potential risks of receiving the Services via telemedicine include:  Delay or interruption in medical evaluation due to technological equipment failure or disruption; Information transmitted may not be sufficient (e.g.  poor resolution of images) to allow for appropriate medical decision making by the Practitioner; and/or  In rare instances, security protocols could fail, causing a breach of personal health information.  Furthermore, I acknowledge that it is my responsibility to provide information about my medical history, conditions and care that is complete and accurate to the best of my ability. I acknowledge that Practitioner's advice, recommendations, and/or decision may be based on factors not within their control, such as incomplete or inaccurate data provided by me or distortions of diagnostic images or specimens that may result from electronic transmissions. I understand that the practice of medicine is not an exact science and that Practitioner makes no warranties or guarantees regarding treatment outcomes. I acknowledge that a copy of this consent can be made available to me via my patient portal [facility] MyChart), or I can request a printed copy by calling the office of [facility].    I understand that my insurance will be billed for this visit.   I have read or had this consent read to me. I understand the contents of this consent, which adequately explains the benefits and risks of the Services being provided via telemedicine.  I have been provided ample opportunity to ask questions regarding this consent and the Services and have had my questions answered to my satisfaction. I give my informed consent for the services to be provided through the use of telemedicine in my medical care

## 2024-12-11 NOTE — Telephone Encounter (Signed)
 See result note, no significant arrhythmias on Zio patch

## 2024-12-12 DIAGNOSIS — Z6830 Body mass index (BMI) 30.0-30.9, adult: Secondary | ICD-10-CM | POA: Diagnosis not present

## 2024-12-12 DIAGNOSIS — Z01419 Encounter for gynecological examination (general) (routine) without abnormal findings: Secondary | ICD-10-CM | POA: Diagnosis not present

## 2024-12-13 ENCOUNTER — Inpatient Hospital Stay
Admission: RE | Admit: 2024-12-13 | Discharge: 2024-12-13 | Attending: Obstetrics and Gynecology | Admitting: Obstetrics and Gynecology

## 2024-12-13 DIAGNOSIS — Z1231 Encounter for screening mammogram for malignant neoplasm of breast: Secondary | ICD-10-CM | POA: Diagnosis not present

## 2024-12-24 ENCOUNTER — Ambulatory Visit

## 2024-12-26 ENCOUNTER — Ambulatory Visit: Admitting: Physician Assistant

## 2024-12-26 DIAGNOSIS — Z01810 Encounter for preprocedural cardiovascular examination: Secondary | ICD-10-CM

## 2024-12-26 NOTE — Progress Notes (Signed)
 "   Virtual Visit via Telephone Note   Because of Andrea Rhodes co-morbid illnesses, she is at least at moderate risk for complications without adequate follow up.  This format is felt to be most appropriate for this patient at this time.  Due to technical limitations with video connection (technology), today's appointment will be conducted as an audio only telehealth visit, and [name] verbally agreed to proceed in this manner.   All issues noted in this document were discussed and addressed.  No physical exam could be performed with this format.  Evaluation Performed:  Preoperative cardiovascular risk assessment _____________   Date:  12/26/2024   Patient ID:  Andrea Rhodes, DOB 1971/09/13, MRN [id number] Patient Location:  Home Provider location:   Office  Primary Care Provider:  Loretha Richerd SAUNDERS, MD Primary Cardiologist:  Lonni LITTIE Nanas, MD  Chief Complaint / Patient Profile   53 y.o. y/o female with a h/o hypertension, asthma, GERD, PVCs, fibromyalgia, psoriatic arthritis who is pending colonoscopy and presents today for telephonic preoperative cardiovascular risk assessment.  History of Present Illness    Andrea Rhodes is a 53 y.o. female who presents via audio/video conferencing for a telehealth visit today.  Pt was last seen in cardiology clinic on 10/23/2024 by Dr. Nanas.  At that time Andrea Rhodes was doing well.  The patient is now pending procedure as outlined above. Since her last visit, she tells me that she has been doing well from a heart standpoint.  No chest pain or shortness of breath.  Blood pressure has been well-controlled as of late.  Her physical capacity is limited due to her psoriatic arthritis.  Eventually, she will need a hip replacement.  For this reason she is unable to walk 1-2 blocks.  She has a lot of trouble with stairs as well.  However, she does surpass 4 METS on the DASI.  No blood thinner or ASA.    Past Medical History    Past Medical History:  Diagnosis Date   ARTHRITIS    Arthritis    Asthma    ASTHMA, UNSPECIFIED    Carpal tunnel syndrome    CONSTIPATION    Diabetes mellitus without complication (HCC)    FATTY LIVER DISEASE    Fibromyalgia    GERD    HYPERTENSION    Irritable bowel syndrome    NEPHROLITHIASIS, HX OF    OBESITY    Psoriasis    Past Surgical History:  Procedure Laterality Date   BLADDER SURGERY     laparoscopy 1984     VAGINAL HYSTERECTOMY      Allergies  Allergies[1]  Home Medications    Prior to Admission medications  Medication Sig Start Date End Date Taking? Authorizing Provider  albuterol (PROVENTIL HFA;VENTOLIN HFA) 108 (90 BASE) MCG/ACT inhaler Inhale 2 puffs into the lungs every 4 (four) hours as needed for wheezing or shortness of breath.    [provider]  betamethasone  valerate (VALISONE ) 0.1 % cream Apply topically as needed. 07/08/23   [provider]  bimekizumab-bkzx [facility]) 160 MG/ML prefilled syringe Inject 160 mg into the skin every 28 (twenty-eight) days.    [provider]  bismuth  subsalicylate (PEPTO BISMOL) 262 MG chewable tablet Chew 2 tablets (524 mg total) by mouth as needed. 11/24/24   [name], DO  budesonide-formoterol (SYMBICORT) 160-4.5 MCG/ACT inhaler Inhale 2 puffs into the lungs as needed. 06/11/24 06/11/25  [provider]  Cholecalciferol (VITAMIN D) 2000 UNITS CAPS Take 1 capsule  by mouth daily.    [provider]  clobetasol ointment (TEMOVATE) 0.05 % Apply 1 Application topically as needed. 01/23/24   [provider]  dicyclomine  (BENTYL ) 20 MG tablet Take 1 tablet (20 mg total) by mouth 2 (two) times daily. 11/24/24   [name], DO  estradiol (ESTRACE) 1 MG tablet Take 1 mg by mouth daily.    [provider]  famotidine  (PEPCID ) 20 MG tablet Take 1 tablet (20 mg total) by mouth daily. 12/16/23   [name], PA  folic acid  (FOLVITE) 1 MG tablet Take 1 mg by mouth daily. Patient takes 2 tablets daily    [provider]  linaclotide  (LINZESS ) 145 MCG CAPS capsule Take 1 capsule (145 mcg total) by mouth daily before breakfast. 12/06/24 12/01/25  May, [name], NP  lisinopril-hydrochlorothiazide (ZESTORETIC) 20-25 MG tablet Take 1 tablet by mouth daily. 05/04/21   [provider]  melatonin 5 MG TABS Take 5 mg by mouth at bedtime as needed (sleep).    [provider]  methocarbamol (ROBAXIN) 500 MG tablet Take 500 mg by mouth daily. 02/19/24   [provider]  MOUNJARO 5 MG/0.5ML Pen Inject 5 mg into the skin once a week. 06/26/24   [provider]  naproxen (NAPROSYN) 500 MG tablet Take 500 mg by mouth 2 (two) times daily as needed.    [provider]  Omega-3 Fatty Acids (FT FISH OIL) 650 MG CPDR Take 1 capsule by mouth daily.    [provider]  omeprazole  (PRILOSEC) 20 MG capsule Take 20 mg by mouth daily. Patient taking differently: Take 40 mg by mouth daily.    [provider]  oxyCODONE  (ROXICODONE ) 5 MG immediate release tablet Take 1 tablet (5 mg total) by mouth every 4 (four) hours as needed for moderate pain (pain score 4-6). 11/24/24   [name], DO  pimecrolimus (ELIDEL) 1 % cream Apply 1 Application topically daily as needed (itching).    [provider]  pravastatin (PRAVACHOL) 10 MG tablet Take 10 mg by mouth at bedtime. 07/03/24   [provider]  predniSONE (DELTASONE) 1 MG tablet Take 1 mg by mouth daily. Patient not taking: Reported on 12/11/2024 01/26/24   [provider]  traZODone (DESYREL) 50 MG tablet Take 50 mg by mouth at bedtime. Patient taking differently: Take 50 mg by mouth at bedtime as needed for sleep. 06/11/24   [provider]  XDEMVY 0.25 % SOLN Place 1 drop into both eyes in the morning and at bedtime. 09/24/24   [provider]    Physical Exam    Vital Signs:  Agustina  Rhodes does not have vital signs available for review today.  Given telephonic nature of communication, physical exam is limited. AAOx3. NAD. Normal affect.  Speech and respirations are unlabored.  Accessory Clinical Findings    None  Assessment & Plan    1.  Preoperative Cardiovascular Risk Assessment:  Ms. Kandler perioperative risk of a major cardiac event is 0.4% according to the Revised Cardiac Risk Index (RCRI).  Therefore, she is at low risk for perioperative complications.   Her functional capacity is fair at 4.3 METs according to the [facility] Activity Status Index (DASI). Recommendations: According to ACC/AHA guidelines, no further cardiovascular testing needed.  The patient may proceed to surgery at acceptable risk.     The patient was advised that if she develops new symptoms prior to surgery to contact our office to arrange  for a follow-up visit, and she verbalized understanding.  A copy of this note will be routed to requesting surgeon.  Time:   Today, I have spent 5 minutes with the patient with telehealth technology discussing medical history, symptoms, and management plan.     Orren LOISE Fabry, PA-C  12/26/2024, 9:45 AM     [1]  Allergies Allergen Reactions   Meperidine Hcl     REACTION: nausea, vomiting   Nystatin      REACTION: itching, hives   Other     Silver and Nickel   "

## 2025-01-03 ENCOUNTER — Encounter: Payer: Self-pay | Admitting: Internal Medicine

## 2025-01-07 ENCOUNTER — Other Ambulatory Visit

## 2025-01-07 DIAGNOSIS — Z006 Encounter for examination for normal comparison and control in clinical research program: Secondary | ICD-10-CM

## 2025-01-07 LAB — GENECONNECT MOLECULAR SCREEN

## 2025-01-10 ENCOUNTER — Ambulatory Visit: Admitting: Internal Medicine

## 2025-01-10 ENCOUNTER — Encounter (INDEPENDENT_AMBULATORY_CARE_PROVIDER_SITE_OTHER): Payer: Self-pay

## 2025-01-10 ENCOUNTER — Encounter: Payer: Self-pay | Admitting: Internal Medicine

## 2025-01-10 VITALS — BP 112/62 | HR 71 | Temp 97.7°F | Resp 17 | Ht 63.0 in | Wt 174.0 lb

## 2025-01-10 DIAGNOSIS — K635 Polyp of colon: Secondary | ICD-10-CM

## 2025-01-10 DIAGNOSIS — R1032 Left lower quadrant pain: Secondary | ICD-10-CM

## 2025-01-10 DIAGNOSIS — D120 Benign neoplasm of cecum: Secondary | ICD-10-CM | POA: Diagnosis present

## 2025-01-10 DIAGNOSIS — D123 Benign neoplasm of transverse colon: Secondary | ICD-10-CM

## 2025-01-10 DIAGNOSIS — K648 Other hemorrhoids: Secondary | ICD-10-CM | POA: Diagnosis not present

## 2025-01-10 DIAGNOSIS — K529 Noninfective gastroenteritis and colitis, unspecified: Secondary | ICD-10-CM

## 2025-01-10 MED ORDER — HYDROCORTISONE (PERIANAL) 2.5 % EX CREA: 1.0000 | TOPICAL_CREAM | Freq: Two times a day (BID) | CUTANEOUS | 1 refills | Status: AC

## 2025-01-10 MED ORDER — SODIUM CHLORIDE 0.9 % IV SOLN: 500.0000 mL | Freq: Once | INTRAVENOUS | Status: DC

## 2025-01-10 NOTE — Progress Notes (Signed)
 Called to room to assist during endoscopic procedure.  Patient ID and intended procedure confirmed with present staff. Received instructions for my participation in the procedure from the performing physician.

## 2025-01-10 NOTE — Op Note (Signed)
 [facility] Patient Name: Andrea Rhodes Procedure Date: 01/10/2025 9:04 AM MRN: [id number] Endoscopist: Rosario Estefana Kidney , , [id number] Age: 54 Referring MD:  Date of Birth: Oct 30, 1971 Gender: Female Account #: [id number] Procedure:                Colonoscopy Indications:              Rectal bleeding, Abnormal CT of the GI tract Medicines:                Monitored Anesthesia Care Procedure:                Pre-Anesthesia Assessment:                           - Prior to the procedure, a History and Physical                            was performed, and patient medications and                            allergies were reviewed. The patient's tolerance of                            previous anesthesia was also reviewed. The risks                            and benefits of the procedure and the sedation                            options and risks were discussed with the patient.                            All questions were answered, and informed consent                            was obtained. Prior Anticoagulants: The patient has                            taken no anticoagulant or antiplatelet agents. ASA                            Grade Assessment: II - A patient with mild systemic                            disease. After reviewing the risks and benefits,                            the patient was deemed in satisfactory condition to                            undergo the procedure.                           After obtaining informed consent, the colonoscope  was passed under direct vision. Throughout the                            procedure, the patient's blood pressure, pulse, and                            oxygen saturations were monitored continuously. The                            Olympus Scope SN: [id number] was introduced through                            the anus and advanced to the the terminal ileum.                            The  colonoscopy was performed without difficulty.                            The patient tolerated the procedure well. The                            quality of the bowel preparation was excellent. The                            terminal ileum, ileocecal valve, appendiceal                            orifice, and rectum were photographed. Scope In: 9:21:46 AM Scope Out: 9:39:47 AM Scope Withdrawal Time: 0 hours 13 minutes 58 seconds  Total Procedure Duration: 0 hours 18 minutes 1 second  Findings:                 The terminal ileum appeared normal.                           Three sessile polyps were found in the transverse                            colon and cecum. The polyps were 3 to 5 mm in size.                            These polyps were removed with a cold snare.                            Resection and retrieval were complete.                           Non-bleeding internal hemorrhoids were found during                            retroflexion. Complications:            No immediate complications. Estimated Blood Loss:     Estimated blood loss was minimal. Impression:               -  The examined portion of the ileum was normal.                           - Three 3 to 5 mm polyps in the transverse colon                            and in the cecum, removed with a cold snare.                            Resected and retrieved.                           - Non-bleeding internal hemorrhoids. Recommendation:           - Discharge patient to home (with escort).                           - No signs of colitis on today's exam. It is                            suspected that your rectal bleeding was either due                            to hemorrhoids or infectious colitis.                           - Await pathology results.                           - Anusol  HC cream BID for 7 days.                           - The findings and recommendations were discussed                            with the  patient. Dr Estefana Federico Rosario Estefana Federico,  01/10/2025 9:45:36 AM

## 2025-01-10 NOTE — Progress Notes (Signed)
 "   GASTROENTEROLOGY PROCEDURE H&P NOTE   Primary Care Physician: Loretha Richerd SAUNDERS, MD    Reason for Procedure:   Rectal bleeding, colitis in the descending and sigmoid colon on CT scan  Plan:    Colonoscopy  Patient is appropriate for endoscopic procedure(s) in the ambulatory ([facility]) setting.  The nature of the procedure, as well as the risks, benefits, and alternatives were carefully and thoroughly reviewed with the patient. Ample time for discussion and questions allowed. The patient understood, was satisfied, and agreed to proceed.     HPI: Andrea Rhodes is a 54 y.o. female who presents for colonoscopy for evaluation of rectal bleeding, colitis in the descending and sigmoid colon on CT scan.  Patient was most recently seen in the Gastroenterology Clinic on 11/28/24.  No interval change in medical history since that appointment. Please refer to that note for full details regarding GI history and clinical presentation.   Past Medical History:  Diagnosis Date   ARTHRITIS    Arthritis    Asthma    ASTHMA, UNSPECIFIED    Carpal tunnel syndrome    CONSTIPATION    Diabetes mellitus without complication (HCC)    FATTY LIVER DISEASE    Fibromyalgia    GERD    HYPERTENSION    Irritable bowel syndrome    NEPHROLITHIASIS, HX OF    OBESITY    Psoriasis     Past Surgical History:  Procedure Laterality Date   BLADDER SURGERY     laparoscopy 1984     VAGINAL HYSTERECTOMY      Prior to Admission medications  Medication Sig Start Date End Date Taking? Authorizing Provider  albuterol (PROVENTIL HFA;VENTOLIN HFA) 108 (90 BASE) MCG/ACT inhaler Inhale 2 puffs into the lungs every 4 (four) hours as needed for wheezing or shortness of breath.    [provider]  betamethasone  valerate (VALISONE ) 0.1 % cream Apply topically as needed. 07/08/23   [provider]  bimekizumab-bkzx [facility]) 160 MG/ML prefilled syringe Inject 160 mg into the skin every 28  (twenty-eight) days.    [provider]  bismuth  subsalicylate (PEPTO BISMOL) 262 MG chewable tablet Chew 2 tablets (524 mg total) by mouth as needed. 11/24/24   [name], DO  budesonide-formoterol (SYMBICORT) 160-4.5 MCG/ACT inhaler Inhale 2 puffs into the lungs as needed. 06/11/24 06/11/25  [provider]  Cholecalciferol (VITAMIN D) 2000 UNITS CAPS Take 1 capsule by mouth daily.    [provider]  clobetasol ointment (TEMOVATE) 0.05 % Apply 1 Application topically as needed. 01/23/24   [provider]  dicyclomine  (BENTYL ) 20 MG tablet Take 1 tablet (20 mg total) by mouth 2 (two) times daily. 11/24/24   [name], DO  estradiol (ESTRACE) 1 MG tablet Take 1 mg by mouth daily.    [provider]  famotidine  (PEPCID ) 20 MG tablet Take 1 tablet (20 mg total) by mouth daily. 12/16/23   [name], PA  folic acid (FOLVITE) 1 MG tablet Take 1 mg by mouth daily. Patient takes 2 tablets daily    [provider]  linaclotide  (LINZESS ) 145 MCG CAPS capsule Take 1 capsule (145 mcg total) by mouth daily before breakfast. 12/06/24 12/01/25  May, [name], NP  lisinopril-hydrochlorothiazide (ZESTORETIC) 20-25 MG tablet Take 1 tablet by mouth daily. 05/04/21   [provider]  melatonin 5 MG TABS Take 5 mg by mouth at bedtime as needed (sleep).    [provider]  methocarbamol (ROBAXIN) 500  MG tablet Take 500 mg by mouth daily. 02/19/24   [provider]  MOUNJARO 5 MG/0.5ML Pen Inject 5 mg into the skin once a week. 06/26/24   [provider]  naproxen (NAPROSYN) 500 MG tablet Take 500 mg by mouth 2 (two) times daily as needed.    [provider]  Omega-3 Fatty Acids (FT FISH OIL) 650 MG CPDR Take 1 capsule by mouth daily.    [provider]  omeprazole  (PRILOSEC) 20 MG capsule Take 20 mg by mouth daily. Patient taking differently: Take 40 mg by mouth daily.    [provider]   oxyCODONE  (ROXICODONE ) 5 MG immediate release tablet Take 1 tablet (5 mg total) by mouth every 4 (four) hours as needed for moderate pain (pain score 4-6). 11/24/24   [name], DO  pimecrolimus (ELIDEL) 1 % cream Apply 1 Application topically daily as needed (itching).    [provider]  pravastatin (PRAVACHOL) 10 MG tablet Take 10 mg by mouth at bedtime. 07/03/24   [provider]  predniSONE (DELTASONE) 1 MG tablet Take 1 mg by mouth daily. Patient not taking: Reported on 12/11/2024 01/26/24   [provider]  traZODone (DESYREL) 50 MG tablet Take 50 mg by mouth at bedtime. Patient taking differently: Take 50 mg by mouth at bedtime as needed for sleep. 06/11/24   [provider]  XDEMVY 0.25 % SOLN Place 1 drop into both eyes in the morning and at bedtime. 09/24/24   [provider]    Current Outpatient Medications  Medication Sig Dispense Refill   albuterol (PROVENTIL HFA;VENTOLIN HFA) 108 (90 BASE) MCG/ACT inhaler Inhale 2 puffs into the lungs every 4 (four) hours as needed for wheezing or shortness of breath.     betamethasone  valerate (VALISONE ) 0.1 % cream Apply topically as needed.     bimekizumab-bkzx (BIMZELX) 160 MG/ML prefilled syringe Inject 160 mg into the skin every 28 (twenty-eight) days.     bismuth  subsalicylate (PEPTO BISMOL) 262 MG chewable tablet Chew 2 tablets (524 mg total) by mouth as needed. 30 tablet 0   budesonide-formoterol (SYMBICORT) 160-4.5 MCG/ACT inhaler Inhale 2 puffs into the lungs as needed.     Cholecalciferol (VITAMIN D) 2000 UNITS CAPS Take 1 capsule by mouth daily.     clobetasol ointment (TEMOVATE) 0.05 % Apply 1 Application topically as needed.     dicyclomine  (BENTYL ) 20 MG tablet Take 1 tablet (20 mg total) by mouth 2 (two) times daily. 20 tablet 0   estradiol (ESTRACE) 1 MG tablet Take 1 mg by mouth daily.     famotidine  (PEPCID ) 20 MG tablet Take 1 tablet (20 mg total) by mouth daily. 90 tablet 3    folic acid (FOLVITE) 1 MG tablet Take 1 mg by mouth daily. Patient takes 2 tablets daily     linaclotide  (LINZESS ) 145 MCG CAPS capsule Take 1 capsule (145 mcg total) by mouth daily before breakfast. 90 capsule 3   lisinopril-hydrochlorothiazide (ZESTORETIC) 20-25 MG tablet Take 1 tablet by mouth daily.     melatonin 5 MG TABS Take 5 mg by mouth at bedtime as needed (sleep).     methocarbamol (ROBAXIN) 500 MG tablet Take 500 mg by mouth daily.     MOUNJARO 5 MG/0.5ML Pen Inject 5 mg into the skin once a week.     naproxen (NAPROSYN) 500 MG tablet Take 500 mg by mouth 2 (two) times daily as needed.     Omega-3 Fatty Acids (FT FISH  OIL) 650 MG CPDR Take 1 capsule by mouth daily.     omeprazole  (PRILOSEC) 20 MG capsule Take 20 mg by mouth daily. (Patient taking differently: Take 40 mg by mouth daily.)     oxyCODONE  (ROXICODONE ) 5 MG immediate release tablet Take 1 tablet (5 mg total) by mouth every 4 (four) hours as needed for moderate pain (pain score 4-6). 5 tablet 0   pimecrolimus (ELIDEL) 1 % cream Apply 1 Application topically daily as needed (itching).     pravastatin (PRAVACHOL) 10 MG tablet Take 10 mg by mouth at bedtime.     predniSONE (DELTASONE) 1 MG tablet Take 1 mg by mouth daily. (Patient not taking: Reported on 12/11/2024)     traZODone (DESYREL) 50 MG tablet Take 50 mg by mouth at bedtime. (Patient taking differently: Take 50 mg by mouth at bedtime as needed for sleep.)     XDEMVY 0.25 % SOLN Place 1 drop into both eyes in the morning and at bedtime.     No current facility-administered medications for this visit.    Allergies as of 01/10/2025 - Review Complete 11/28/2024  Allergen Reaction Noted   Nystatin  Hives and Itching 01/07/2011   Meperidine hcl Nausea And Vomiting 01/07/2011   Other  11/15/2014    Family History  Problem Relation Age of Onset   Diabetes Mother    Hypertension Mother    Lung cancer Father        PTSD   Multiple sclerosis Maternal Aunt        no  family hx   Glaucoma Maternal Grandmother    Breast cancer Cousin 7   Cancer Other        1st cousin, Breast CA, deceased at 29y   Colon cancer Neg Hx    Esophageal cancer Neg Hx    Rectal cancer Neg Hx    Stomach cancer Neg Hx     Social History   Socioeconomic History   Marital status: Married    Spouse name: Not on file   Number of children: Not on file   Years of education: Not on file   Highest education level: Not on file  Occupational History   Not on file  Tobacco Use   Smoking status: Never   Smokeless tobacco: Never  Vaping Use   Vaping status: Never Used  Substance and Sexual Activity   Alcohol use: No    Alcohol/week: 0.0 standard drinks of alcohol   Drug use: No   Sexual activity: Not on file  Other Topics Concern   Not on file  Social History Narrative   Caffeine 3 x week, FT Medical Coding compliance auditor at [employer], Married, 2 kids.     Social Drivers of Health   Tobacco Use: Low Risk (01/10/2025)   Patient History    Smoking Tobacco Use: Never    Smokeless Tobacco Use: Never    Passive Exposure: Not on file  Financial Resource Strain: Not on file  Food Insecurity: Low Risk (06/11/2024)   Received from [facility]   Epic    Within the past 12 months, you worried that your food would run out before you got money to buy more: Never true    Within the past 12 months, the food you bought just didn't last and you didn't have money to get more. : Never true  Transportation Needs: No Transportation Needs (06/11/2024)   Received from [facility]    In the past 12 months, has  lack of reliable transportation kept you from medical appointments, meetings, work or from getting things needed for daily living? : No  Physical Activity: Not on file  Stress: Not on file  Social Connections: Not on file  Intimate Partner Violence: Not on file  Depression (EYV7-0): Not on file  Alcohol Screen: Not on file  Housing: Low Risk (06/11/2024)    Received from [facility]   Epic    What is your living situation today?: I have a steady place to live    Think about the place you live. Do you have problems with any of the following? Choose all that apply:: None/None on this list  Utilities: Low Risk (06/11/2024)   Received from [facility]   Utilities    In the past 12 months has the electric, gas, oil, or water company threatened to shut off services in your home? : No  Health Literacy: Not on file    Physical Exam: Vital signs in last 24 hours: BP 127/78   Pulse 83   Temp 97.7 F (36.5 C) (Other (Comment)) Comment (Src): forehead  Ht 5' 3 (1.6 m)   Wt 174 lb (78.9 kg)   SpO2 98%   BMI 30.82 kg/m  GEN: NAD EYE: Sclerae anicteric ENT: MMM CV: Non-tachycardic Pulm: No increased WOB GI: Soft NEURO:  Alert & Oriented   Estefana Kidney, MD [facility]   01/10/2025 8:28 AM  "

## 2025-01-10 NOTE — Progress Notes (Signed)
 Sedate, gd SR, tolerated procedure well, VSS, report to RN

## 2025-01-10 NOTE — Patient Instructions (Signed)
 Please read handouts provided. Await pathology results. Anusol  HC cream twice daily for 7 days.   YOU HAD AN ENDOSCOPIC PROCEDURE TODAY AT THE [facility]:   Refer to the procedure report that was given to you for any specific questions about what was found during the examination.  If the procedure report does not answer your questions, please call your gastroenterologist to clarify.  If you requested that your care partner not be given the details of your procedure findings, then the procedure report has been included in a sealed envelope for you to review at your convenience later.  YOU SHOULD EXPECT: Some feelings of bloating in the abdomen. Passage of more gas than usual.  Walking can help get rid of the air that was put into your GI tract during the procedure and reduce the bloating. If you had a lower endoscopy (such as a colonoscopy or flexible sigmoidoscopy) you may notice spotting of blood in your stool or on the toilet paper. If you underwent a bowel prep for your procedure, you may not have a normal bowel movement for a few days.  Please Note:  You might notice some irritation and congestion in your nose or some drainage.  This is from the oxygen used during your procedure.  There is no need for concern and it should clear up in a day or so.  SYMPTOMS TO REPORT IMMEDIATELY:  Following lower endoscopy (colonoscopy or flexible sigmoidoscopy):  Excessive amounts of blood in the stool  Significant tenderness or worsening of abdominal pains  Swelling of the abdomen that is new, acute  Fever of 100F or higher.  For urgent or emergent issues, a gastroenterologist can be reached at any hour by calling [phone]. Do not use MyChart messaging for urgent concerns.    DIET:  We do recommend a small meal at first, but then you may proceed to your regular diet.  Drink plenty of fluids but you should avoid alcoholic beverages for 24 hours.  ACTIVITY:  You should plan to take  it easy for the rest of today and you should NOT DRIVE or use heavy machinery until tomorrow (because of the sedation medicines used during the test).    FOLLOW UP: Our staff will call the number listed on your records the next business day following your procedure.  We will call around 7:15- 8:00 am to check on you and address any questions or concerns that you may have regarding the information given to you following your procedure. If we do not reach you, we will leave a message.     If any biopsies were taken you will be contacted by phone or by letter within the next 1-3 weeks.  Please call us  at [phone] if you have not heard about the biopsies in 3 weeks.    SIGNATURES/CONFIDENTIALITY: You and/or your care partner have signed paperwork which will be entered into your electronic medical record.  These signatures attest to the fact that that the information above on your After Visit Summary has been reviewed and is understood.  Full responsibility of the confidentiality of this discharge information lies with you and/or your care-partner.

## 2025-01-11 ENCOUNTER — Telehealth: Payer: Self-pay | Admitting: Lactation Services

## 2025-01-11 NOTE — Telephone Encounter (Signed)
" °  Follow up Call-     01/10/2025    8:35 AM  Call back number  Post procedure Call Back phone  # [phone]  Permission to leave phone message Yes     Patient questions:  Do you have a fever, pain , or abdominal swelling? No. Pain Score  0 *  Have you tolerated food without any problems? Yes.    Have you been able to return to your normal activities? Yes.    Do you have any questions about your discharge instructions: Diet   No. Medications  No. Follow up visit  No.  Do you have questions or concerns about your Care? Yes.    Actions: * If pain score is 4 or above: No action needed, pain <4.   "

## 2025-01-14 ENCOUNTER — Other Ambulatory Visit: Payer: Self-pay | Admitting: Physician Assistant

## 2025-01-15 NOTE — Addendum Note (Signed)
 Addended by: DELPHINE BRUNO HERO on: 01/15/2025 12:41 PM   Modules accepted: Orders

## 2025-01-16 ENCOUNTER — Ambulatory Visit: Payer: Self-pay | Admitting: Internal Medicine

## 2025-01-16 LAB — SURGICAL PATHOLOGY

## 2025-01-22 ENCOUNTER — Telehealth: Payer: Self-pay

## 2025-01-22 NOTE — Telephone Encounter (Signed)
 Left message for pt to call back

## 2025-01-22 NOTE — Telephone Encounter (Signed)
 Reminder was received in Epic for 6 month follow up. Pt was made aware.  Pt stated that she recently had an office visit with Deanna May on 11/28/2024, Labs were ordered and completed as well. CMET done on 11/28/2024. Normal.  Please review and advise on future follow up of fatty liver.

## 2025-01-22 NOTE — Telephone Encounter (Signed)
-----   Message from Nurse Elspeth RAMAN, RN sent at 08/22/2024  4:29 PM EDT ----- Personal reminder sent on 08/22/2024  follow up GI clinic appt in 6 months for fatty liver.  Pt needs to be scheduled with Dr. Federico near 2/27/ 2026

## 2025-01-30 LAB — GENECONNECT MOLECULAR SCREEN: Genetic Analysis Overall Interpretation: NEGATIVE
